# Patient Record
Sex: Female | Born: 1970 | Race: White | Hispanic: No | Marital: Married | Smoking: Never smoker
Health system: Southern US, Community
[De-identification: ages and names within clinical notes are randomized; demographics above are authoritative.]

## PROBLEM LIST (undated history)

## (undated) DIAGNOSIS — R002 Palpitations: Secondary | ICD-10-CM

## (undated) DIAGNOSIS — E039 Hypothyroidism, unspecified: Secondary | ICD-10-CM

## (undated) DIAGNOSIS — E079 Disorder of thyroid, unspecified: Secondary | ICD-10-CM

## (undated) DIAGNOSIS — E282 Polycystic ovarian syndrome: Secondary | ICD-10-CM

## (undated) HISTORY — DX: Disorder of thyroid, unspecified: E07.9

## (undated) HISTORY — DX: Polycystic ovarian syndrome: E28.2

## (undated) HISTORY — DX: Palpitations: R00.2

## (undated) HISTORY — PX: TONSILLECTOMY: SUR1361

---

## 1999-12-20 ENCOUNTER — Other Ambulatory Visit: Admission: RE | Admit: 1999-12-20 | Discharge: 1999-12-20 | Payer: Self-pay | Admitting: Obstetrics and Gynecology

## 2001-04-21 ENCOUNTER — Other Ambulatory Visit: Admission: RE | Admit: 2001-04-21 | Discharge: 2001-04-21 | Payer: Self-pay | Admitting: Obstetrics and Gynecology

## 2002-04-27 ENCOUNTER — Other Ambulatory Visit: Admission: RE | Admit: 2002-04-27 | Discharge: 2002-04-27 | Payer: Self-pay | Admitting: Obstetrics and Gynecology

## 2002-12-29 ENCOUNTER — Other Ambulatory Visit: Admission: RE | Admit: 2002-12-29 | Discharge: 2002-12-29 | Payer: Self-pay | Admitting: Obstetrics and Gynecology

## 2003-10-04 ENCOUNTER — Other Ambulatory Visit: Admission: RE | Admit: 2003-10-04 | Discharge: 2003-10-04 | Payer: Self-pay | Admitting: Obstetrics and Gynecology

## 2009-05-05 ENCOUNTER — Ambulatory Visit: Payer: Self-pay | Admitting: Family Medicine

## 2009-05-05 DIAGNOSIS — E282 Polycystic ovarian syndrome: Secondary | ICD-10-CM | POA: Insufficient documentation

## 2009-05-05 DIAGNOSIS — N912 Amenorrhea, unspecified: Secondary | ICD-10-CM | POA: Insufficient documentation

## 2009-05-05 DIAGNOSIS — E039 Hypothyroidism, unspecified: Secondary | ICD-10-CM | POA: Insufficient documentation

## 2009-05-05 DIAGNOSIS — R5381 Other malaise: Secondary | ICD-10-CM | POA: Insufficient documentation

## 2009-05-05 DIAGNOSIS — R002 Palpitations: Secondary | ICD-10-CM | POA: Insufficient documentation

## 2009-05-05 DIAGNOSIS — R5383 Other fatigue: Secondary | ICD-10-CM

## 2009-05-05 LAB — CONVERTED CEMR LAB: Beta hcg, urine, semiquantitative: NEGATIVE

## 2009-05-06 ENCOUNTER — Telehealth: Payer: Self-pay | Admitting: Family Medicine

## 2009-05-07 ENCOUNTER — Encounter: Payer: Self-pay | Admitting: Family Medicine

## 2009-05-07 LAB — CONVERTED CEMR LAB
Basophils Absolute: 0 10*3/uL (ref 0.0–0.1)
CO2: 26 meq/L (ref 19–32)
Calcium: 9.2 mg/dL (ref 8.4–10.5)
Chloride: 106 meq/L (ref 96–112)
Cholesterol: 202 mg/dL — ABNORMAL HIGH (ref 0–200)
Direct LDL: 151.7 mg/dL
Free T4: 1.2 ng/dL (ref 0.6–1.6)
HDL: 42.1 mg/dL (ref >39.00)
Lymphocytes Relative: 28.2 % (ref 12.0–46.0)
Monocytes Relative: 4.9 % (ref 3.0–12.0)
Neutrophils Relative %: 64.7 % (ref 43.0–77.0)
Platelets: 210 10*3/uL (ref 150.0–400.0)
RDW: 12.9 % (ref 11.5–14.6)
Sodium: 139 meq/L (ref 135–145)
TSH: 0.39 microintl units/mL (ref 0.35–5.50)
Total CHOL/HDL Ratio: 5
Triglycerides: 71 mg/dL (ref 0.0–149.0)
VLDL: 14.2 mg/dL (ref 0.0–40.0)

## 2009-05-09 ENCOUNTER — Ambulatory Visit: Payer: Self-pay | Admitting: Cardiology

## 2009-05-31 ENCOUNTER — Encounter: Payer: Self-pay | Admitting: Cardiology

## 2009-05-31 ENCOUNTER — Ambulatory Visit: Payer: Self-pay

## 2009-08-26 ENCOUNTER — Ambulatory Visit: Payer: Self-pay | Admitting: Family Medicine

## 2009-08-26 DIAGNOSIS — N920 Excessive and frequent menstruation with regular cycle: Secondary | ICD-10-CM | POA: Insufficient documentation

## 2009-08-29 LAB — CONVERTED CEMR LAB
Basophils Absolute: 0 10*3/uL (ref 0.0–0.1)
Hemoglobin: 8.7 g/dL — ABNORMAL LOW (ref 12.0–15.0)
LH: 7.24 milliintl units/mL
Lymphocytes Relative: 29.3 % (ref 12.0–46.0)
Monocytes Relative: 6.6 % (ref 3.0–12.0)
Neutro Abs: 4.3 10*3/uL (ref 1.4–7.7)
Neutrophils Relative %: 60.4 % (ref 43.0–77.0)
RBC: 2.82 M/uL — ABNORMAL LOW (ref 3.87–5.11)
RDW: 13 % (ref 11.5–14.6)

## 2009-08-30 ENCOUNTER — Ambulatory Visit: Payer: Self-pay | Admitting: Family Medicine

## 2009-08-30 DIAGNOSIS — D649 Anemia, unspecified: Secondary | ICD-10-CM | POA: Insufficient documentation

## 2009-08-30 LAB — CONVERTED CEMR LAB: hCG, Beta Chain, Quant, S: 0 milliintl units/mL

## 2009-08-31 LAB — CONVERTED CEMR LAB
Transferrin: 322.2 mg/dL (ref 212.0–360.0)
Vitamin B-12: 306 pg/mL (ref 211–911)

## 2010-09-12 NOTE — Assessment & Plan Note (Signed)
Summary: F/U DLO   Vital Signs:  Patient profile:   40 year old female Height:      68.5 inches Weight:      229.38 pounds BMI:     34.49 Temp:     98.5 degrees F oral Pulse rate:   76 / minute Pulse rhythm:   regular BP sitting:   134 / 88  (left arm) Cuff size:   large  Vitals Entered By: Delilah Shan CMA Duncan Dull) (August 30, 2009 9:57 AM) CC: follow-up visit   History of Present Illness: 40 yo female here for f/u  heavy period. Period has been getting lighter and lighter, but still spotting. This month, she started her period on 08/19/09 and it has been extremely heavy, going through a pad every few hours, will heavy blood clots. Now it is lightening up, using only one pad per day. Was a little dizzy when she stood up earlier this week, now feeling a little short of breath with exertion.  No cramping.  No CP. Checked CBC this week which showed normocytic anemia - hbg 8.7, with MCV 92.  Current Medications (verified): 1)  Synthroid 88 Mcg Tabs (Levothyroxine Sodium) .... Take 1 Tablet By Mouth Once A Day 2)  Ferrous Fumarate 325 Mg Tabs (Ferrous Fumarate) .Marland Kitchen.. 1 By Mouth Bid  Allergies (verified): No Known Drug Allergies  Review of Systems      See HPI General:  Complains of fatigue and malaise. CV:  Denies chest pain or discomfort. Neuro:  Denies headaches, sensation of room spinning, tingling, visual disturbances, and weakness.  Physical Exam  General:  alert and well-developed, obese.   Lungs:  Normal respiratory effort, chest expands symmetrically. Lungs are clear to auscultation, no crackles or wheezes. Heart:  Normal rate and regular rhythm. S1 and S2 normal without gallop, murmur, click, rub or other extra sounds. Psych:  normally interactive and good eye contact.     Impression & Recommendations:  Problem # 1:  ANEMIA, NORMOCYTIC (ICD-285.9) Assessment New  Likely due to menorrhagia although I would expect to see a microcytic anemia. So will check TIBC,  folate and B12 today. Start Ferrous Sulfate 325 mg two times a day. Per pt, OBGYN nurse asked her to get Bhcg blood test.   The following medications were removed from the medication list:    Ferrous Fumarate 325 Mg Tabs (Ferrous fumarate) .Marland Kitchen... 1 by mouth daily Her updated medication list for this problem includes:    Ferrous Fumarate 325 Mg Tabs (Ferrous fumarate) .Marland Kitchen... 1 by mouth bid  Orders: Venipuncture (16109) TLB-B12 + Folate Pnl (60454_09811-B14/[id number]) TLB-IBC Pnl (Iron/FE;Transferrin) (83550-IBC)  Complete Medication List: 1)  Synthroid 88 Mcg Tabs (Levothyroxine sodium) .... Take 1 tablet by mouth once a day 2)  Ferrous Fumarate 325 Mg Tabs (Ferrous fumarate) .Marland Kitchen.. 1 by mouth bid  Other Orders: T- * Misc. Laboratory test [phone])  Patient Instructions: 1)  Good to see you, Ms. Sher. 2)  Please come back to see me in 2-3 months. 3)  I will be in touch with your labs. Prescriptions: FERROUS FUMARATE 325 MG TABS (FERROUS FUMARATE) 1 by mouth bid  #90 x 3   Entered and Authorized by:   Ruthe Mannan MD   Signed by:   Ruthe Mannan MD on 08/30/2009   Method used:   Electronically to        [facility]       [address]       [place], [place]  [address]       Ph: [phone]       Fax: [phone]   RxID:   [id number] FERROUS FUMARATE 325 MG TABS (FERROUS FUMARATE) 1 by mouth daily  #90 x 3   Entered and Authorized by:   Ruthe Mannan MD   Signed by:   Ruthe Mannan MD on 08/30/2009   Method used:   Electronically to        [facility]       [address]       [place], [place]  [address]       Ph: [phone]       Fax: [phone]   RxID:   [id number]   Current Allergies (reviewed today): No known allergies  Appended Document: F/U DLO

## 2010-09-12 NOTE — Assessment & Plan Note (Signed)
Summary: CYCLE HEAVY OVER 1 WK-NO PAIN  CYD   Vital Signs:  Patient profile:   40 year old female Height:      68.5 inches Weight:      234 pounds BMI:     35.19 Temp:     98.6 degrees F oral Pulse rate:   84 / minute Pulse rhythm:   regular BP sitting:   150 / 84  (left arm) Cuff size:   large  Vitals Entered By: Delilah Shan CMA Duncan Dull) (August 26, 2009 10:17 AM) CC: Cycle heavy - no pain   History of Present Illness: 40 yo female here for heavy period.  Has had known PCOS for years.     Had tried Clomid and Metformin and stopped both due to unwanted side effects.  started seeing Cheryll Dessert due to infertility.  Had bee on OCPs which regulated her periods but she stopped months ago to try getting pregnant. Has been going to accupuncture to help which she says she feels much better since stopping all medication and going for accupuncture.  Since that time, periods have been very irregular, spotting throughout the month. This month, she started her period on 08/19/09 and it has been extremely heavy, going through a pad every few hours, will heavy blood clots. Now it is lightening up, using only one pad per day. Was a little dizzy when she stood up quickly yesterday, otherwise she feels normal. No cramping.    Current Medications (verified): 1)  Synthroid 88 Mcg Tabs (Levothyroxine Sodium) .... Take 1 Tablet By Mouth Once A Day  Allergies (verified): No Known Drug Allergies  Review of Systems      See HPI General:  Denies malaise. CV:  Denies chest pain or discomfort. Resp:  Denies shortness of breath.  Physical Exam  General:  alert and well-developed, obese.   Eyes:  No corneal or conjunctival inflammation noted. EOMI. Perrla. Funduscopic exam benign, without hemorrhages, exudates or papilledema. Vision grossly normal. Mouth:  Oral mucosa and oropharynx without lesions or exudates.  Teeth in good repair. Lungs:  Normal respiratory effort, chest expands symmetrically.  Lungs are clear to auscultation, no crackles or wheezes. Heart:  Normal rate and regular rhythm. S1 and S2 normal without gallop, murmur, click, rub or other extra sounds. Neurologic:  strength normal in all extremities.   Psych:  normally interactive and good eye contact.     Impression & Recommendations:  Problem # 1:  MENORRHAGIA (ICD-626.2) Assessment Improved Known PCOS.  Will check CBC to make sure she does not have iron deficiency anemia.  She is not interested in OCPs as she is trying to get pregnant.  Will test LH/FSH today as well as she is interested in knowing what her testosterone/estrogen level.  After we check FSH/LH, will plan on checking DHEA-S, testosterone if she would like to continue work up.  I did mention that she should return to infertliity specialist if she truly would like to pursue this.  She will discuss with husband. Orders: Venipuncture (87564) TLB-CBC Platelet - w/Differential (85025-CBCD) Venipuncture (33295) TLB-FSH (Follicle Stimulating Hormone) (83001-FSH) TLB-Luteinizing Hormone (LH) (83002-LH)  Complete Medication List: 1)  Synthroid 88 Mcg Tabs (Levothyroxine sodium) .... Take 1 tablet by mouth once a day  Current Allergies (reviewed today): No known allergies

## 2010-10-06 ENCOUNTER — Telehealth: Payer: Self-pay | Admitting: Family Medicine

## 2010-10-10 NOTE — Progress Notes (Signed)
Summary: Possible miscarriage  Phone Note Call from Patient   Caller: Patient Call For: Ruthe Mannan MD Summary of Call: Patient called to say that she thinks she may have had a miscarriage.  Advised patient to go to the ER because she may have to have an I&D and that is something we don't do here in the office.  Stated that she woke up during the night bleeding pretty heavy and passing blood clots as big as her hand.  Advised patient to call us back and let us know how she is doing but go on over to the ER. Initial call taken by: Linde Gillis CMA Duncan Dull),  October 06, 2010 8:21 AM  Follow-up for Phone Call        yes she needs to go to [facility] immediately.  please keep me updated. Ruthe Mannan MD  October 06, 2010 9:37 AM  Patient already advised.  Follow-up by: Linde Gillis CMA Duncan Dull),  October 06, 2010 9:42 AM

## 2010-12-29 ENCOUNTER — Encounter: Payer: Self-pay | Admitting: Family Medicine

## 2011-01-01 ENCOUNTER — Encounter: Payer: Self-pay | Admitting: Family Medicine

## 2011-01-01 ENCOUNTER — Ambulatory Visit (INDEPENDENT_AMBULATORY_CARE_PROVIDER_SITE_OTHER): Payer: BC Managed Care – PPO | Admitting: Family Medicine

## 2011-01-01 VITALS — BP 140/90 | HR 90 | Temp 98.3°F | Ht 68.5 in | Wt 229.4 lb

## 2011-01-01 DIAGNOSIS — E039 Hypothyroidism, unspecified: Secondary | ICD-10-CM

## 2011-01-01 DIAGNOSIS — E282 Polycystic ovarian syndrome: Secondary | ICD-10-CM

## 2011-01-01 LAB — TSH: TSH: 0.75 u[IU]/mL (ref 0.35–5.50)

## 2011-01-01 NOTE — Progress Notes (Signed)
40 yo female here to discuss infertility.  Has had known PCOS and hypothyroidism  for years.      Had tried Clomid and Metformin and stopped both due to unwanted side effects.  started seeing Cheryll Dessert due to infertility.  Had been on OCPs which regulated her periods but she years ago to get pregnant. Has been going to accupuncture to help which she says she feels much better since stopping all medication and going for accupuncture.  Since that time, periods have been very irregular, spotting throughout the month.  Seeing Dr. Billy Coast.  Wants to see another OBGYN but is not yet ready for IVF.    Hypothyroidism- does feel like her skin is dry and hair is brittle. Denies any other symptoms of hypo or hyperthyroidism.   Review of Systems       See HPI General:  Denies malaise. CV:  Denies chest pain or discomfort. Resp:  Denies shortness of breath.  Physical Exam BP 140/90  Pulse 90  Temp(Src) 98.3 F (36.8 C) (Oral)  Ht 5' 8.5" (1.74 m)  Wt 229 lb 6.4 oz (104.055 kg)  BMI 34.37 kg/m2  LMP 12/04/2010  General:  alert and well-developed, obese.   Eyes:  No corneal or conjunctival inflammation noted. EOMI. Perrla. Funduscopic exam benign, without hemorrhages, exudates or papilledema. Vision grossly normal. Mouth:  Oral mucosa and oropharynx without lesions or exudates.  Teeth in good repair. Lungs:  Normal respiratory effort, chest expands symmetrically. Lungs are clear to auscultation, no crackles or wheezes. Heart:  Normal rate and regular rhythm. S1 and S2 normal without gallop, murmur, click, rub or other extra sounds. Neurologic:  strength normal in all extremities.   Psych:  Tearful, good eye contact.

## 2011-01-01 NOTE — Assessment & Plan Note (Signed)
Unchanged with infertility issues. >25 min spent with face to face with patient counseling and coordinating care Given names of other OBGYNs in town. Pt will call around and let me know where she decides on establishing care.

## 2011-01-01 NOTE — Assessment & Plan Note (Signed)
Recheck TSH today.  

## 2011-08-08 ENCOUNTER — Ambulatory Visit (INDEPENDENT_AMBULATORY_CARE_PROVIDER_SITE_OTHER): Payer: BC Managed Care – PPO | Admitting: Internal Medicine

## 2011-08-08 ENCOUNTER — Encounter: Payer: Self-pay | Admitting: Internal Medicine

## 2011-08-08 DIAGNOSIS — R509 Fever, unspecified: Secondary | ICD-10-CM

## 2011-08-08 DIAGNOSIS — J111 Influenza due to unidentified influenza virus with other respiratory manifestations: Secondary | ICD-10-CM

## 2011-08-08 DIAGNOSIS — K649 Unspecified hemorrhoids: Secondary | ICD-10-CM

## 2011-08-08 LAB — POCT INFLUENZA A/B
Influenza A, POC: NEGATIVE
Influenza B, POC: NEGATIVE

## 2011-08-08 MED ORDER — HYDROCORTISONE 2.5 % RE CREA: TOPICAL_CREAM | Freq: Two times a day (BID) | RECTAL | Status: AC

## 2011-08-08 MED ORDER — OSELTAMIVIR PHOSPHATE 75 MG PO CAPS: 75.0000 mg | ORAL_CAPSULE | Freq: Two times a day (BID) | ORAL | Status: AC

## 2011-08-08 NOTE — Progress Notes (Signed)
Subjective:    Patient ID: Melanie Hanson, female    DOB: [birth year], 40 y.o.   MRN: [id number]  HPI 40 year old female presents for an acute visit complaining of sudden onset this morning of fever, chills, myalgia, general malaise. She also notes mild nausea. She has not taken any medication for her symptoms. 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 She did not have her flu shot.   Outpatient Encounter Prescriptions as of 08/08/2011  Medication Sig Dispense Refill  . levothyroxine (SYNTHROID, LEVOTHROID) 88 MCG tablet Take 88 mcg by mouth daily.        . hydrocortisone (ANUSOL-HC) 2.5 % rectal cream Place rectally 2 (two) times daily.  30 g  0    Review of Systems  Constitutional: Positive for fever and chills. Negative for appetite change, fatigue and unexpected weight change.  HENT: Positive for congestion. Negative for ear pain, sore throat, trouble swallowing, neck pain, voice change and sinus pressure.   Eyes: Negative for visual disturbance.  Respiratory: Positive for cough. Negative for shortness of breath, wheezing and stridor.   Cardiovascular: Negative for chest pain, palpitations and leg swelling.  Gastrointestinal: Negative for nausea, vomiting, abdominal pain, diarrhea, constipation, blood in stool, abdominal distention and anal bleeding.  Genitourinary: Negative for dysuria and flank pain.  Musculoskeletal: Positive for myalgias. Negative for arthralgias and gait problem.  Skin: Negative for color change and rash.  Neurological: Negative for dizziness and headaches.  Hematological: Negative for adenopathy. Does not bruise/bleed easily.  Psychiatric/Behavioral: Negative for suicidal ideas, sleep disturbance and dysphoric mood. The patient is not nervous/anxious.    BP 138/80  Pulse 95  Temp(Src) 100.3 F (37.9 C) (Oral)  Wt 221 lb (100.245 kg)  SpO2 97%     Objective:   Physical Exam  Constitutional: She is oriented to person, place, and time. She appears well-developed and well-nourished. No distress.  HENT:  Head: Normocephalic and atraumatic.  Right Ear: External ear normal. A middle ear effusion is present.  Left Ear: External ear normal. A middle ear effusion is present.  Nose: Nose normal.  Mouth/Throat: Oropharynx is clear and moist. No oropharyngeal exudate.  Eyes: Conjunctivae are normal. Pupils are equal, round, and reactive to light. Right eye exhibits no discharge. Left eye exhibits no discharge. No scleral icterus.  Neck: Normal range of motion. Neck supple. No tracheal deviation present. No thyromegaly present.  Cardiovascular: Normal rate, regular rhythm, normal heart sounds and intact distal pulses.  Exam reveals no gallop and no friction rub.   No murmur heard. Pulmonary/Chest: Effort normal and breath sounds normal. No respiratory distress. She has no wheezes. She has no rales. She exhibits no tenderness.  Musculoskeletal: Normal range of motion. She exhibits no edema and no tenderness.  Lymphadenopathy:    She has no cervical adenopathy.  Neurological: She is alert and oriented to person, place, and time. No cranial nerve deficit. She exhibits normal muscle tone. Coordination normal.  Skin: Skin is warm and dry. No rash noted. She is not diaphoretic. No erythema. No pallor.  Psychiatric: She has a normal mood and affect. Her behavior is normal. Judgment and thought content normal.          Assessment & Plan:  1. Influenza - Symptoms with sudden onset fever, chills, myalgia are most consistent with influenza. Will treat with Tamiflu 75mg  po bid x 5 days. Pt will call if symptoms not  improving over next 72hr.

## 2011-10-12 ENCOUNTER — Encounter: Payer: Self-pay | Admitting: Family Medicine

## 2011-10-12 ENCOUNTER — Ambulatory Visit (INDEPENDENT_AMBULATORY_CARE_PROVIDER_SITE_OTHER): Payer: BC Managed Care – PPO | Admitting: Family Medicine

## 2011-10-12 VITALS — BP 122/74 | HR 94 | Temp 99.3°F | Wt 215.2 lb

## 2011-10-12 DIAGNOSIS — J111 Influenza due to unidentified influenza virus with other respiratory manifestations: Secondary | ICD-10-CM

## 2011-10-12 MED ORDER — OSELTAMIVIR PHOSPHATE 75 MG PO CAPS: 75.0000 mg | ORAL_CAPSULE | Freq: Two times a day (BID) | ORAL | Status: DC

## 2011-10-12 NOTE — Progress Notes (Signed)
  Subjective:    Patient ID: Melanie Hanson, female    DOB: 1970-10-12, 41 y.o.   MRN: [id number]  HPI Started getting sick wed night - started with a cough  Last night fever 102 Also achey and chilled  Chest congestion - not painful Some mucous - is clear at this point  Throat and ears are fine Just a little runny nose  No n/v/d    Has had flu and uri this season  Last uri lasted 3 weeks Did not have a flu shot this year   Patient Active Problem List  Diagnoses  . Unspecified hypothyroidism  . POLYCYSTIC OVARIAN DISEASE  . ANEMIA, NORMOCYTIC  . AMENORRHEA  . MENORRHAGIA  . FATIGUE  . PALPITATIONS  . Influenza with respiratory manifestations   Past Medical History  Diagnosis Date  . Thyroid disease   . PCOS (polycystic ovarian syndrome)   . Palpitations    Past Surgical History  Procedure Date  . Tonsillectomy    History  Substance Use Topics  . Smoking status: Never Smoker   . Smokeless tobacco: Not on file  . Alcohol Use: Yes   Family History  Problem Relation Age of Onset  . Hypertension Father   . Diabetes Paternal Grandfather    No Known Allergies Current Outpatient Prescriptions on File Prior to Visit  Medication Sig Dispense Refill  . levothyroxine (SYNTHROID, LEVOTHROID) 88 MCG tablet Take 88 mcg by mouth daily.            Review of Systems Review of Systems  Constitutional: Negative for  unexpected weight change.  Eyes: Negative for pain and visual disturbance.  ENT pos for rhinorrhea/ cong neg for ear pain  Respiratory: pos for cough with clear mucous , neg for wheeze or sob    Cardiovascular: Negative for cp or palpitations    Gastrointestinal: Negative for nausea, diarrhea and constipation.  Genitourinary: Negative for urgency and frequency.  Skin: Negative for pallor or rash   Neurological: Negative for weakness, light-headedness, numbness and headaches.  Hematological: Negative for adenopathy. Does not bruise/bleed easily.    Psychiatric/Behavioral: Negative for dysphoric mood. The patient is not nervous/anxious.          Objective:   Physical Exam  Constitutional: She appears well-developed and well-nourished. No distress.  HENT:  Head: Normocephalic and atraumatic.  Right Ear: External ear normal.  Left Ear: External ear normal.  Mouth/Throat: Oropharynx is clear and moist. No oropharyngeal exudate.       Nares are injected and congested  No sinus tenderness Throat - post clear nasal drip- no erythema   Eyes: Conjunctivae and EOM are normal. Pupils are equal, round, and reactive to light. Right eye exhibits no discharge. Left eye exhibits no discharge. No scleral icterus.  Neck: Normal range of motion. Neck supple. No thyromegaly present.  Cardiovascular: Normal rate, regular rhythm and normal heart sounds.   Pulmonary/Chest: Effort normal and breath sounds normal. No respiratory distress. She has no wheezes. She has no rales. She exhibits no tenderness.       Harsh bs Loud cough    Abdominal: Soft. Bowel sounds are normal.  Lymphadenopathy:    She has no cervical adenopathy.  Skin: Skin is dry. No rash noted.  Psychiatric: She has a normal mood and affect.          Assessment & Plan:

## 2011-10-12 NOTE — Patient Instructions (Signed)
I think you have the flu  Tylenol for fever / aches / chills - up to every 4 hours (fever goes up at night) Start tamiflu- take for 5 days  Drink lots of fluids and rest this week Try mucinex DM for cough as needed

## 2011-10-12 NOTE — Assessment & Plan Note (Signed)
Within 48 hours- will start tamiflu Disc symptomatic care - see instructions on AVS  Update if not starting to improve in a week or if worsening   Disc staying out of work until better  Will rest this wkend

## 2011-10-16 ENCOUNTER — Encounter (HOSPITAL_COMMUNITY): Payer: Self-pay | Admitting: Emergency Medicine

## 2011-10-16 ENCOUNTER — Emergency Department (HOSPITAL_COMMUNITY)
Admission: EM | Admit: 2011-10-16 | Discharge: 2011-10-16 | Disposition: A | Discharge status: Home / Self Care | Payer: BC Managed Care – PPO | Attending: Emergency Medicine | Admitting: Emergency Medicine

## 2011-10-16 DIAGNOSIS — R404 Transient alteration of awareness: Secondary | ICD-10-CM | POA: Insufficient documentation

## 2011-10-16 DIAGNOSIS — R55 Syncope and collapse: Secondary | ICD-10-CM

## 2011-10-16 DIAGNOSIS — E282 Polycystic ovarian syndrome: Secondary | ICD-10-CM | POA: Insufficient documentation

## 2011-10-16 DIAGNOSIS — N898 Other specified noninflammatory disorders of vagina: Secondary | ICD-10-CM | POA: Insufficient documentation

## 2011-10-16 DIAGNOSIS — E039 Hypothyroidism, unspecified: Secondary | ICD-10-CM | POA: Insufficient documentation

## 2011-10-16 HISTORY — DX: Hypothyroidism, unspecified: E03.9

## 2011-10-16 LAB — BASIC METABOLIC PANEL
BUN: 10 mg/dL (ref 6–23)
CO2: 25 mEq/L (ref 19–32)
Calcium: 8.8 mg/dL (ref 8.4–10.5)
Chloride: 98 mEq/L (ref 96–112)
Creatinine, Ser: 0.63 mg/dL (ref 0.50–1.10)
GFR calc Af Amer: >90 mL/min (ref >90)
GFR calc non Af Amer: >90 mL/min (ref >90)
Glucose, Bld: 146 mg/dL — ABNORMAL HIGH (ref 70–99)
Potassium: 4.2 mEq/L (ref 3.5–5.1)
Sodium: 132 mEq/L — ABNORMAL LOW (ref 135–145)

## 2011-10-16 LAB — DIFFERENTIAL
Basophils Absolute: 0 10*3/uL (ref 0.0–0.1)
Basophils Relative: 1 % (ref 0–1)
Eosinophils Absolute: 0 10*3/uL (ref 0.0–0.7)
Eosinophils Relative: 1 % (ref 0–5)
Lymphocytes Relative: 44 % (ref 12–46)
Lymphs Abs: 1.5 10*3/uL (ref 0.7–4.0)
Monocytes Absolute: 0.4 10*3/uL (ref 0.1–1.0)
Monocytes Relative: 11 % (ref 3–12)
Neutro Abs: 1.5 10*3/uL — ABNORMAL LOW (ref 1.7–7.7)
Neutrophils Relative %: 44 % (ref 43–77)

## 2011-10-16 LAB — URINE MICROSCOPIC-ADD ON

## 2011-10-16 LAB — URINALYSIS, ROUTINE W REFLEX MICROSCOPIC
Bilirubin Urine: NEGATIVE
Glucose, UA: NEGATIVE mg/dL
Ketones, ur: NEGATIVE mg/dL
Leukocytes, UA: NEGATIVE
Nitrite: NEGATIVE
Protein, ur: NEGATIVE mg/dL
Specific Gravity, Urine: 1.022 (ref 1.005–1.030)
Urobilinogen, UA: 0.2 mg/dL (ref 0.0–1.0)
pH: 6 (ref 5.0–8.0)

## 2011-10-16 LAB — CBC
HCT: 29.2 % — ABNORMAL LOW (ref 36.0–46.0)
Hemoglobin: 9.4 g/dL — ABNORMAL LOW (ref 12.0–15.0)
MCH: 23.8 pg — ABNORMAL LOW (ref 26.0–34.0)
MCHC: 32.2 g/dL (ref 30.0–36.0)
MCV: 73.9 fL — ABNORMAL LOW (ref 78.0–100.0)
Platelets: 197 10*3/uL (ref 150–400)
RBC: 3.95 MIL/uL (ref 3.87–5.11)
RDW: 19.9 % — ABNORMAL HIGH (ref 11.5–15.5)
WBC: 3.3 10*3/uL — ABNORMAL LOW (ref 4.0–10.5)

## 2011-10-16 LAB — POCT PREGNANCY, URINE: Preg Test, Ur: NEGATIVE

## 2011-10-16 MED ORDER — SODIUM CHLORIDE 0.9 % IV BOLUS (SEPSIS)
2000.0000 mL | Freq: Once | INTRAVENOUS | Status: AC
  Administered 2011-10-16: 2000 mL via INTRAVENOUS

## 2011-10-16 NOTE — Discharge Instructions (Signed)
Syncope Syncope (fainting) is a sudden, short loss of consciousness. People normally fall to the ground when they faint. Recovery is often fast. HOME CARE  Do not drive or use machines. Wait until your doctor says it is safe to do so.   If you have diabetes, check your blood sugar. If it is low (below 70), you need to drink or eat something sweet. If over 300, call your doctor.   If you have a blood pressure machine at home, take your blood pressure. If the top number is below 100 or above 170, call your doctor.   Lie down until you feel normal.   Drink extra fluids (water, juice, soup).  GET HELP RIGHT AWAY IF:   You pass out (faint) when sitting or lying down. Do not drive. Call your local emergency services (911 in [place] if no one is there to help you.   There is chest pain.   You feel sick to your stomach (nauseous) or keep throwing up (vomiting).   You have very bad belly (abdominal) pain.   You feel your heartbeat is fast or not normal.   You lose feeling in some part of the body.   You cannot move your arms or legs.   You cannot talk well and get confused.   You feel weak or cannot see well.   You get sweaty and feel lightheaded.  MAKE SURE YOU:   Understand these instructions.   Will watch your condition.   Will get help right away if you are not doing well or get worse.  Document Released: 01/16/2008 Document Revised: 07/19/2011 Document Reviewed: 01/16/2008 [facility] Patient Information 2012 [place], [place].  RESOURCE GUIDE  Dental Problems  Patients with Medicaid: [facility] [phone] [address]                                           [phone] [address] Phone:  [phone]                                                  Phone:  [phone]  If unable to pay or uninsured, contact:  [facility] or [facility]. to become qualified for the adult dental clinic.  Chronic Pain  Problems Contact [facility]  [phone] Patients need to be referred by their primary care doctor.  Insufficient Money for Medicine Contact [employer]:  call "211" or [facility] [phone].  No Primary Care Doctor Call [facility]  [phone] Other agencies that provide inexpensive medical care    [facility]  [phone]    [facility]  [phone]    [facility]  [phone]    [facility]  [phone]    [facility]  5390544592    [facility]  [phone]  Psychological Services [facility]  [phone] [facility]  [phone] [facility]   [phone] (emergency services [phone])  Substance Abuse Resources [facility]  [phone] [facility] [phone] [facility] [phone] [facility] [phone] Residential &  Outpatient Substance Abuse Program  ([phone]  Abuse/Neglect [facility] Child Abuse Hotline 912-346-1605 [facility] Child Abuse Hotline (669) 366-7930 (After Hours)  Emergency Shelter [facility] [phone]  Maternity Homes Room at the [place] [facility] [place] [phone] [facility] 6786544946  MRSA Hotline #:   (916)243-6528    [facility] Resources  [facility]     [employer]                          [facility] Dept. [address] [place]                       [address]      [address] [place] [address]  Blondell Reveal Phone:  [phone]                                   Phone:  [phone]                 Phone:  [phone]  [facility] Mental [facility] Phone:  [phone]  [facility] Child Abuse Hotline [phone] (After Hours)

## 2011-10-16 NOTE — ED Notes (Signed)
PT. REPORTS HEAVY MENSTRUAL BLEEDING / PASSING CLOTS TODAY , NEAR SYNCOPAL EPISODE AT HOME WITH WEAKNESS.

## 2011-10-16 NOTE — ED Provider Notes (Signed)
History    41 year old female with a syncopal event. Patient is ambulating at home when she felt like she is going to pass out. Her husband caught her in order to the ground. Patient apparently did briefly lose consciousness for a period of seconds. There is no seizure activity. There is no postictal state. There is no incontinence. No tongue biting. Patient has a history of PCOS and heavy vaginal bleeding. She has been passing several large clots recently, but this is not uncommon for her. Patient is also getting over a febrile illness which she calls the flu. Patient is complaining of some mild lower, pain which is also not unusual for her. No urinary complaints. No chest pain or shortness of breath. No fevers or chills. No nausea or vomiting.  CSN: [id number]  Arrival date & time 10/16/11  0039   None     Chief Complaint  Patient presents with  . Vaginal Bleeding  . Loss of Consciousness    (Consider location/radiation/quality/duration/timing/severity/associated sxs/prior treatment) HPI  Past Medical History  Diagnosis Date  . Thyroid disease   . PCOS (polycystic ovarian syndrome)   . Palpitations   . Hypothyroidism     Past Surgical History  Procedure Date  . Tonsillectomy     Family History  Problem Relation Age of Onset  . Hypertension Father   . Diabetes Paternal Grandfather     History  Substance Use Topics  . Smoking status: Never Smoker   . Smokeless tobacco: Not on file  . Alcohol Use: Yes    OB History    Grav Para Term Preterm Abortions TAB SAB Ect Mult Living                  Review of Systems   Review of symptoms negative unless otherwise noted in HPI.   Allergies  Review of patient's allergies indicates no known allergies.  Home Medications   Current Outpatient Rx  Name Route Sig Dispense Refill  . LEVOTHYROXINE SODIUM 88 MCG PO TABS Oral Take 88 mcg by mouth daily.      . ADULT MULTIVITAMIN W/MINERALS CH Oral Take 1 tablet by mouth  daily.      BP 117/71  Temp(Src) 98.2 F (36.8 C) (Oral)  Resp 20  SpO2 99%  LMP 10/11/2011  Physical Exam  Nursing note and vitals reviewed. Constitutional: She is oriented to person, place, and time. She appears well-developed and well-nourished. No distress.  HENT:  Head: Normocephalic and atraumatic.  Eyes: Conjunctivae are normal. Pupils are equal, round, and reactive to light. Right eye exhibits no discharge. Left eye exhibits no discharge.  Neck: Neck supple.  Cardiovascular: Normal rate, regular rhythm and normal heart sounds.  Exam reveals no gallop and no friction rub.   No murmur heard. Pulmonary/Chest: Effort normal and breath sounds normal. No respiratory distress.  Abdominal: Soft. She exhibits no distension and no mass. There is no tenderness.  Genitourinary:       No costovertebral angle  Musculoskeletal: She exhibits no edema and no tenderness.  Neurological: She is alert and oriented to person, place, and time. No cranial nerve deficit. She exhibits normal muscle tone. Coordination normal.  Skin: Skin is warm and dry. She is not diaphoretic.  Psychiatric: She has a normal mood and affect. Her behavior is normal. Thought content normal.    ED Course  Procedures (including critical care time)  Labs Reviewed  DIFFERENTIAL - Abnormal; Notable for the following:    Neutro Abs 1.5 (*)  All other components within normal limits  BASIC METABOLIC PANEL - Abnormal; Notable for the following:    Sodium 132 (*)    Glucose, Bld 146 (*)    All other components within normal limits  CBC  URINALYSIS, ROUTINE W REFLEX MICROSCOPIC   No results found.  3:18 AM Laboratory was called concerning the CBC. Per the laboratory tech, only the differential was ordered and not CBC. They will run this now.  EKG:  Rhythm: Normal sinus rhythm Rate: 80 Axis: Normal Intervals: Normal ST segments: Nonspecific T wave changes. There is slight T wave inversions  anteriorly. Comparison: None provided   1. Syncope       MDM  41 year old female with a syncopal event. Suspect that this is related to recent illness and dehydration. Patient is having vaginal bleeding but has a history of polycystic ovarian syndrome and says her current bleeding is not unusual for her at all. Patient is nontoxic. Hemodynamically stable. Nonfocal neurological examination Abdominal exam is benign. EKG is non-provocative. Patient was given a couple liters of IV fluids reports feeling much better. Observed ambulating in emergency room with a steady gait. Patient has no new complaints prior to discharge. Return precautions were discussed with patient and her husband. Outpatient followup.        Raeford Razor, MD 10/16/11 2230

## 2012-11-24 ENCOUNTER — Other Ambulatory Visit: Payer: Self-pay

## 2012-11-24 DIAGNOSIS — Z1231 Encounter for screening mammogram for malignant neoplasm of breast: Secondary | ICD-10-CM

## 2012-12-15 ENCOUNTER — Ambulatory Visit
Admission: RE | Admit: 2012-12-15 | Discharge: 2012-12-15 | Disposition: A | Discharge status: Home / Self Care | Payer: BC Managed Care – PPO | Source: Ambulatory Visit

## 2012-12-15 DIAGNOSIS — Z1231 Encounter for screening mammogram for malignant neoplasm of breast: Secondary | ICD-10-CM

## 2013-05-20 ENCOUNTER — Encounter (INDEPENDENT_AMBULATORY_CARE_PROVIDER_SITE_OTHER): Payer: Self-pay

## 2013-05-20 ENCOUNTER — Ambulatory Visit (INDEPENDENT_AMBULATORY_CARE_PROVIDER_SITE_OTHER): Payer: BC Managed Care – PPO | Admitting: Adult Health

## 2013-05-20 ENCOUNTER — Telehealth: Payer: Self-pay | Admitting: *Deleted

## 2013-05-20 ENCOUNTER — Encounter: Payer: Self-pay | Admitting: Adult Health

## 2013-05-20 VITALS — BP 122/84 | HR 72 | Temp 98.2°F | Resp 12 | Wt 245.0 lb

## 2013-05-20 DIAGNOSIS — R5381 Other malaise: Secondary | ICD-10-CM

## 2013-05-20 DIAGNOSIS — N920 Excessive and frequent menstruation with regular cycle: Secondary | ICD-10-CM

## 2013-05-20 DIAGNOSIS — Z8742 Personal history of other diseases of the female genital tract: Secondary | ICD-10-CM

## 2013-05-20 DIAGNOSIS — R5383 Other fatigue: Secondary | ICD-10-CM

## 2013-05-20 DIAGNOSIS — E039 Hypothyroidism, unspecified: Secondary | ICD-10-CM

## 2013-05-20 LAB — BASIC METABOLIC PANEL
BUN: 12 mg/dL (ref 6–23)
Chloride: 103 mEq/L (ref 96–112)
Creatinine, Ser: 0.7 mg/dL (ref 0.4–1.2)
Glucose, Bld: 95 mg/dL (ref 70–99)

## 2013-05-20 LAB — HEMOGLOBIN: Hemoglobin: 11.5 g/dL — ABNORMAL LOW (ref 12.0–15.0)

## 2013-05-20 NOTE — Progress Notes (Signed)
  Subjective:    Patient ID: Melanie Hanson, female    DOB: 1971/05/21, 42 y.o.   MRN: [id number]  HPI  Pt is a pleasant 42 year old female with hx of PCOS and hypothyroidism who presents to clinic with concerns of heavy menstrual bleeding and fear that her hemoglobin and iron level might be too low. She is complaining of fatigue. Denies any pelvic or abdominal pain. She is very emotional during the visit as she is discussing that she has been the only income provided in her home for the past 6 years. Her husband lost his job and has not found employment since. She would like to get pregnant and has been trying to for some time now without success. She is followed by OB/GYN in [place]. Discussion of possibly restarting Clomid. She is currently not being followed by an endocrinologist.   Current Outpatient Prescriptions on File Prior to Visit  Medication Sig Dispense Refill  . levothyroxine (SYNTHROID, LEVOTHROID) 88 MCG tablet Take 88 mcg by mouth daily.        . Multiple Vitamin (MULITIVITAMIN WITH MINERALS) TABS Take 1 tablet by mouth daily.       No current facility-administered medications on file prior to visit.     Review of Systems  Constitutional: Negative.   Respiratory: Negative.   Cardiovascular: Negative.   Gastrointestinal: Negative.   Genitourinary: Positive for menstrual problem.  Psychiatric/Behavioral: The patient is nervous/anxious.        Objective:   Physical Exam  Constitutional: She is oriented to person, place, and time.  Pleasant 42 year old female, tearful during visit. No apparent physical distress  HENT:  Head: Normocephalic and atraumatic.  Cardiovascular: Normal rate, regular rhythm and normal heart sounds.  Exam reveals no gallop.   No murmur heard. Pulmonary/Chest: Effort normal and breath sounds normal. No respiratory distress. She has no wheezes. She has no rales.  Neurological: She is alert and oriented to person, place, and time.  Psychiatric:  Judgment and thought content normal.  Tearful. Appears mildly depressed given circumstances.    BP 122/84  Pulse 72  Temp(Src) 98.2 F (36.8 C) (Oral)  Resp 12  Wt 245 lb (111.131 kg)  BMI 36.71 kg/m2  SpO2 97%       Assessment & Plan:

## 2013-05-20 NOTE — Assessment & Plan Note (Signed)
Check TSH, vitamin D and vitamin B12 levels

## 2013-05-20 NOTE — Assessment & Plan Note (Signed)
Followed by OB/GYN in [place]. Check iron, ferritin, hemoglobin.

## 2013-05-20 NOTE — Assessment & Plan Note (Signed)
Check TSH 

## 2013-05-20 NOTE — Telephone Encounter (Signed)
Pt requested refill of Jublia for toenail fungus. Left message for pt that she would need to contact her PCP for refill per Raquel.

## 2013-05-21 LAB — TSH: TSH: 0.79 u[IU]/mL (ref 0.35–5.50)

## 2013-05-21 LAB — FERRITIN: Ferritin: 4.6 ng/mL — ABNORMAL LOW (ref 10.0–291.0)

## 2013-05-21 LAB — VITAMIN B12: Vitamin B-12: 323 pg/mL (ref 211–911)

## 2013-05-22 ENCOUNTER — Encounter: Payer: Self-pay | Admitting: *Deleted

## 2014-03-02 ENCOUNTER — Ambulatory Visit (INDEPENDENT_AMBULATORY_CARE_PROVIDER_SITE_OTHER): Payer: BC Managed Care – PPO | Admitting: Family Medicine

## 2014-03-02 ENCOUNTER — Encounter: Payer: Self-pay | Admitting: Family Medicine

## 2014-03-02 VITALS — BP 118/78 | HR 82 | Temp 98.4°F | Ht 68.25 in | Wt 242.5 lb

## 2014-03-02 DIAGNOSIS — E282 Polycystic ovarian syndrome: Secondary | ICD-10-CM

## 2014-03-02 DIAGNOSIS — E039 Hypothyroidism, unspecified: Secondary | ICD-10-CM

## 2014-03-02 DIAGNOSIS — IMO0002 Reserved for concepts with insufficient information to code with codable children: Secondary | ICD-10-CM

## 2014-03-02 DIAGNOSIS — R635 Abnormal weight gain: Secondary | ICD-10-CM | POA: Insufficient documentation

## 2014-03-02 DIAGNOSIS — S3992XA Unspecified injury of lower back, initial encounter: Secondary | ICD-10-CM

## 2014-03-02 LAB — T3, FREE: T3 FREE: 2.5 pg/mL (ref 2.3–4.2)

## 2014-03-02 LAB — TSH: TSH: 1.54 u[IU]/mL (ref 0.35–4.50)

## 2014-03-02 LAB — T4, FREE: FREE T4: 1.05 ng/dL (ref 0.60–1.60)

## 2014-03-02 MED ORDER — CYCLOBENZAPRINE HCL 5 MG PO TABS: 5.0000 mg | ORAL_TABLET | Freq: Every day | ORAL | Status: DC

## 2014-03-02 NOTE — Progress Notes (Signed)
   Subjective:   Patient ID: Melanie Hanson, female    DOB: 05-30-1971, 43 y.o.   MRN: [id number]  Melanie EchevariaCarmen B Schachter is a pleasant 43 y.o. year old female with h/o hypothyroidism and PCOS who presents to clinic today with Weight Gain and Back Pain  on 03/02/2014  HPI: I have not seen pt since 12/2010.  Weight gain- she has been walking but feels she cannot lose weight. Wt Readings from Last 3 Encounters:  03/02/14 242 lb 8 oz (109.997 kg)  05/20/13 245 lb (111.131 kg)  10/12/11 215 lb 4 oz (97.637 kg)    On synthroid 88 mcg daily for hypothyroidism.   Dr. Billy Coastaavon checked TSH in June and she was told it was abnormal. Has been feeling "more moody" and having hot flashes. No changes in her bowel habits or skin. Lab Results  Component Value Date   TSH 0.79 05/20/2013   Back injury- picked up her 16seven year old niece last Thursday, immediately felt a "catch."  Since then left lower back hurts.  Worse with changing positions.  No LE weakness or radiculopathy.  Current Outpatient Prescriptions on File Prior to Visit  Medication Sig Dispense Refill  . levothyroxine (SYNTHROID, LEVOTHROID) 88 MCG tablet Take 88 mcg by mouth daily.        . Multiple Vitamin (MULITIVITAMIN WITH MINERALS) TABS Take 1 tablet by mouth daily.       No current facility-administered medications on file prior to visit.    No Known Allergies  Past Medical History  Diagnosis Date  . Thyroid disease   . PCOS (polycystic ovarian syndrome)   . Palpitations   . Hypothyroidism     Past Surgical History  Procedure Laterality Date  . Tonsillectomy      Family History  Problem Relation Age of Onset  . Hypertension Father   . Diabetes Paternal Grandfather     History   Social History  . Marital Status: Married    Spouse Name: N/A    Number of Children: N/A  . 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 Smoking status: Never Smoker   .  Smokeless tobacco: Not on file  . Alcohol Use: Yes  . Drug Use: No  . Sexual Activity:    Other Topics Concern  . Not on file   Social History Narrative   [place] in [place]   The PMH, PSH, Social History, Family History, Medications, and allergies [facility], and have been updated if relevant.    Review of Systems See HPI Denies any urinary incontinence +anxiety but denies feeling depressed- no SI    Objective:    BP 118/78  Pulse 82  Temp(Src) 98.4 F (36.9 C) (Oral)  Ht 5' 8.25" (1.734 m)  Wt 242 lb 8 oz (109.997 kg)  BMI 36.58 kg/m2  SpO2 98%  LMP 12/17/2013   Physical Exam Gen: alert, obese, NAD HEENT: Enlarged thyroid without masses or TTP MSK:+ tightness and TTP over left paraspinous muscles, SLR neg bilaterally, normal gait Psych:  Tearful but appropriate       Assessment & Plan:   Weight gain  Unspecified hypothyroidism  POLYCYSTIC OVARIAN DISEASE No Follow-up on file.

## 2014-03-02 NOTE — Patient Instructions (Signed)
It was great to see you.  Try flexeril as needed at bedtime. You can take Alleve with food.  I will call you with your lab results.

## 2014-03-02 NOTE — Progress Notes (Signed)
Pre visit review using our clinic review tool, if applicable. No additional management support is needed unless otherwise documented below in the visit note. 

## 2014-03-02 NOTE — Assessment & Plan Note (Signed)
Discussed weight loss plan. She is not interested in referral to nutritionist or weight loss medication at this time- cost is an issue since her husband has been out of work.  She feels this is due to her thyroid.  See below.

## 2014-03-02 NOTE — Assessment & Plan Note (Signed)
Explained to MS. Melanie Hanson that I cannot adjust her meds based on her stating that it was abnormal at her OBGYN- I need these records.  I suggested we get a copy of the results but she prefers to recheck her thyroid studies here today.  Orders Placed This Encounter  Procedures  . TSH  . T4, Free  . T3, free

## 2014-03-02 NOTE — Assessment & Plan Note (Signed)
  For acute pain, rest, intermittent application of cold packs (later, may switch to heat, but do not sleep on heating pad), analgesics and muscle relaxants (eRx sent for flexeril) are recommended. Discussed longer term treatment plan of prn NSAID's and discussed a home back care exercise program with flexion exercise routine. Proper lifting with avoidance of heavy lifting discussed. Consider Physical Therapy and XRay studies if not improving. Call or return to clinic prn if these symptoms worsen or fail to improve as anticipated.

## 2015-09-15 ENCOUNTER — Encounter: Payer: Self-pay | Admitting: Primary Care

## 2015-09-15 ENCOUNTER — Ambulatory Visit (INDEPENDENT_AMBULATORY_CARE_PROVIDER_SITE_OTHER): Payer: BLUE CROSS/BLUE SHIELD | Admitting: Primary Care

## 2015-09-15 ENCOUNTER — Ambulatory Visit (INDEPENDENT_AMBULATORY_CARE_PROVIDER_SITE_OTHER)
Admission: RE | Admit: 2015-09-15 | Discharge: 2015-09-15 | Disposition: A | Discharge status: Home / Self Care | Payer: BLUE CROSS/BLUE SHIELD | Source: Ambulatory Visit | Attending: Primary Care | Admitting: Primary Care

## 2015-09-15 ENCOUNTER — Ambulatory Visit: Payer: Self-pay | Admitting: Family Medicine

## 2015-09-15 ENCOUNTER — Telehealth: Payer: Self-pay | Admitting: Primary Care

## 2015-09-15 ENCOUNTER — Other Ambulatory Visit: Payer: Self-pay | Admitting: Primary Care

## 2015-09-15 VITALS — BP 138/82 | HR 72 | Temp 98.0°F | Ht 68.25 in | Wt 251.8 lb

## 2015-09-15 DIAGNOSIS — M25532 Pain in left wrist: Secondary | ICD-10-CM

## 2015-09-15 DIAGNOSIS — T148XXA Other injury of unspecified body region, initial encounter: Secondary | ICD-10-CM

## 2015-09-15 DIAGNOSIS — M79642 Pain in left hand: Secondary | ICD-10-CM

## 2015-09-15 NOTE — Telephone Encounter (Signed)
Correction:  Will have her follow up with Dr. Patsy Lager in 1 week rather than sending to ortho. Will you please schedule? Thanks.

## 2015-09-15 NOTE — Progress Notes (Signed)
   Subjective:    Patient ID: Melanie Hanson, female    DOB: [birth year], 45 y.o.   MRN: [id number]  HPI  Mr. Tuel is a 45 year old female who presents today with a chief complaint of hand pain. Her pain is located to the left wrist, 4th and 5th digits and has been present since falling last night. She accidentally slipped and fell on her outstretched hand last night. She's noticed increased swelling and brising since her fall last night. She's taken tylenol and has been icing her fingers with some reduction in pain. Denies numbness/tinlging.  Review of Systems  Musculoskeletal: Positive for joint swelling and arthralgias.  Skin: Positive for color change.  Neurological: Negative for numbness.       Past Medical History  Diagnosis Date  . Thyroid disease   . PCOS (polycystic ovarian syndrome)   . Palpitations   . Hypothyroidism     Social History   Social History  . Marital Status: Married    Spouse Name: N/A  . Number of Children: N/A  . 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 Smoking status: Never Smoker   . Smokeless tobacco: Not on file  . Alcohol Use: Yes  . Drug Use: No  . Sexual Activity: Not on file   Other Topics Concern  . Not on file   Social History Narrative   Lives with husband in [place]    Past Surgical History  Procedure Laterality Date  . Tonsillectomy      Family History  Problem Relation Age of Onset  . Hypertension Father   . Diabetes Paternal Grandfather     No Known Allergies  Current Outpatient Prescriptions on File Prior to Visit  Medication Sig Dispense Refill  . levothyroxine (SYNTHROID, LEVOTHROID) 88 MCG tablet Take 88 mcg by mouth daily.      . Multiple Vitamin (MULITIVITAMIN WITH MINERALS) TABS Take 1 tablet by mouth daily. Reported on 09/15/2015     No current facility-administered medications on file prior to visit.    BP 138/82 mmHg  Pulse 72   Temp(Src) 98 F (36.7 C) (Oral)  Ht 5' 8.25" (1.734 m)  Wt 251 lb 12.8 oz (114.216 kg)  BMI 37.99 kg/m2  SpO2 98%  LMP 09/11/2015    Objective:   Physical Exam  Constitutional: She appears well-nourished.  Cardiovascular: Normal rate and regular rhythm.   Pulses:      Radial pulses are 2+ on the right side, and 2+ on the left side.  Pulmonary/Chest: Effort normal and breath sounds normal.  Musculoskeletal:       Left wrist: She exhibits decreased range of motion, tenderness and swelling. She exhibits no deformity.  No obvious deformity, however, moderate swelling. Decrease in ROM to left 5th digit, good strength, however painful.  Skin: Skin is warm and dry.  Moderate swelling to left 5th digit. Mild bruising anterior side.          Assessment & Plan:  Hand Pain:  Located to left hand since FOOSH last night. Exam with moderate swelling to 5th digit and mild swelling to wrist. xrays of wrist and digits taken today and are pending. Splint applied to 5th digit and wrist. Ibuprofen, ice, rest. Will refer if necessary.

## 2015-09-15 NOTE — Patient Instructions (Signed)
Complete xray(s) prior to leaving today. I will notify you of your results once received.  Continue to ice and elevate your wrist and hand. Try taking ibuprofen 600 mg three times daily as needed for pain and inflammation.   I will be in touch with you later today regarding your xray results.  It was a pleasure meeting you!

## 2015-09-15 NOTE — Telephone Encounter (Signed)
Called patient and follow up is schedule on 09/21/15.

## 2015-09-21 ENCOUNTER — Encounter: Payer: Self-pay | Admitting: Family Medicine

## 2015-09-21 ENCOUNTER — Ambulatory Visit (INDEPENDENT_AMBULATORY_CARE_PROVIDER_SITE_OTHER): Payer: BLUE CROSS/BLUE SHIELD | Admitting: Family Medicine

## 2015-09-21 VITALS — BP 140/84 | HR 66 | Temp 98.2°F | Ht 68.25 in | Wt 250.2 lb

## 2015-09-21 DIAGNOSIS — S62607D Fracture of unspecified phalanx of left little finger, subsequent encounter for fracture with routine healing: Secondary | ICD-10-CM

## 2015-09-21 NOTE — Progress Notes (Signed)
Pre visit review using our clinic review tool, if applicable. No additional management support is needed unless otherwise documented below in the visit note. 

## 2015-09-21 NOTE — Progress Notes (Signed)
Dr. Karleen Hampshire T. Markala Sitts, MD, CAQ Sports Medicine [facility] Sports [facility] [address] [place], [address] Phone: [phone] Fax: [phone]  09/21/2015  Patient: Melanie Hanson, MRN: [id number], DOB: Nov 30, 1970, 45 y.o.  Primary Physician:  Ruthe Mannan, MD   Chief Complaint  Patient presents with  . Follow-up    Left Pinky Fracture   Subjective:   Melanie Hanson is a 45 y.o. very pleasant female patient who presents with the following:  DOI 09/14/2015  The patient describes it as an where she fell backwards and landed on both hands in an outstretched position.  Initially she was most concerned about her wrists, but both of her wrists are completely recovered down have no pain at all and full range of motion.  She saw Mrs. Clark on September 15, 2015, and an avulsion fracture was seen on her left fifth phalanx.  There is some bruising and pain persistently, but flexion and extension is still preserved.  She was placed in an aluminum splint is here to follow-up with me regarding this injury.  Occupation: Interior and spatial designer  Past Medical History, Surgical History, Social History, Family History, Problem List, Medications, and Allergies have been reviewed and updated if relevant.  Patient Active Problem List   Diagnosis Date Noted  . Weight gain 03/02/2014  . Back injury 03/02/2014  . Influenza with respiratory manifestations 10/12/2011  . ANEMIA, NORMOCYTIC 08/30/2009  . MENORRHAGIA 08/26/2009  . Unspecified hypothyroidism 05/05/2009  . POLYCYSTIC OVARIAN DISEASE 05/05/2009  . AMENORRHEA 05/05/2009  . FATIGUE 05/05/2009  . PALPITATIONS 05/05/2009    Past Medical History  Diagnosis Date  . Thyroid disease   . PCOS (polycystic ovarian syndrome)   . Palpitations   . Hypothyroidism     Past Surgical History  Procedure Laterality Date  . Tonsillectomy      Social History   Social History  . Marital Status: Married    Spouse Name: N/A  . Number of Children:  N/A  . 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 Smoking status: Never Smoker   . Smokeless tobacco: Never Used  . Alcohol Use: 0.0 oz/week    0 Standard drinks or equivalent per week  . Drug Use: No  . Sexual Activity: Not on file   Other Topics Concern  . Not on file   Social History Narrative   Lives with husband in [place]    Family History  Problem Relation Age of Onset  . Hypertension Father   . Diabetes Paternal Grandfather     No Known Allergies  Medication list reviewed and updated in full in [facility] [place] [facility].  GEN: No fevers, chills. Nontoxic. Primarily MSK c/o today. MSK: Detailed in the HPI GI: tolerating PO intake without difficulty Neuro: No numbness, parasthesias, or tingling associated. Otherwise the pertinent positives of the ROS are noted above.   Objective:   BP 140/84 mmHg  Pulse 66  Temp(Src) 98.2 F (36.8 C) (Oral)  Ht 5' 8.25" (1.734 m)  Wt 250 lb 4 oz (113.513 kg)  BMI 37.75 kg/m2  LMP 09/11/2015   GEN: WDWN, NAD, Non-toxic, Alert & Oriented x 3 HEENT: Atraumatic, Normocephalic.  Ears and Nose: No external deformity. EXTR: No clubbing/cyanosis/edema NEURO: Normal gait.  PSYCH: Normally interactive. Conversant. Not depressed or anxious appearing.  Calm demeanor.   L hand Ecchymosis or edema: mild 5th bruising ROM wrist/hand/digits: full  Carpals, MCP's,  digits: NT Distal Ulna and Radius: NT Flexion and ext at MCP, PIP, and DIP preserved at 5th No instability Cysts/nodules: neg Digit triggering: neg Finkelstein's test: neg Snuffbox tenderness: neg Scaphoid tubercle: NT Resisted supination: NT Full composite fist, no malrotation Grip, all digits: 5/5 str DIPJT: NT PIP JT: NT MCP JT: NT No tenosynovitis Axial load test: neg Atrophy: neg  Hand sensation: intact   Radiology: Dg Wrist Complete Left  09/15/2015  CLINICAL DATA:  Pain, fell on hand last  night EXAM: LEFT WRIST - COMPLETE 3+ VIEW COMPARISON:  None. FINDINGS: The distal radius and ulna appear intact. The radiocarpal joint space is normal. The carpal bones are normal position. No acute fracture is seen. IMPRESSION: Negative. Electronically Signed   By: Dwyane Dee M.D.   On: 09/15/2015 12:06   Dg Hand Complete Left  09/15/2015  CLINICAL DATA:  Larey Seat on hand last night with pain EXAM: LEFT HAND - COMPLETE 3+ VIEW COMPARISON:  None. FINDINGS: There is a minimally displaced fracture through the base of the middle phalanx of the left fifth digit on the dorsal aspect. There are degenerative changes throughout the DIP joints. However no other abnormality is seen. IMPRESSION: Avulsion fracture from the base of the middle phalanx of the left fifth digit along the dorsal aspect. Electronically Signed   By: Dwyane Dee M.D.   On: 09/15/2015 12:07    Assessment and Plan:   Closed fracture of phalanx of left little finger with routine healing  Small avulsion at the dorsal aspect of the base of the middle phalanx, doing well clinically.   Removed splint, and I showed her how to appropriately tape to buddy tape with [employer] to use 4th as a splint, which she found comfortable. This should be sufficient. Continue to buddy tape the next 2 1/2 - 3 weeks.  I appreciate the opportunity to evaluate this very friendly patient. If you have any question regarding her care or prognosis, do not hesitate to ask.   Follow-up: prn  Signed,  Daoud Lobue T. Ford Peddie, MD   Patient's Medications  New Prescriptions   No medications on file  Previous Medications   LEVOTHYROXINE (SYNTHROID, LEVOTHROID) 88 MCG TABLET    Take 88 mcg by mouth daily.     MULTIPLE VITAMIN (MULITIVITAMIN WITH MINERALS) TABS    Take 1 tablet by mouth daily. Reported on 09/15/2015  Modified Medications   No medications on file  Discontinued Medications   METFORMIN (GLUCOPHAGE) 1000 MG TABLET

## 2016-04-27 ENCOUNTER — Encounter: Payer: Self-pay | Admitting: Family Medicine

## 2016-04-27 ENCOUNTER — Telehealth: Payer: Self-pay | Admitting: Family Medicine

## 2016-04-27 ENCOUNTER — Ambulatory Visit (INDEPENDENT_AMBULATORY_CARE_PROVIDER_SITE_OTHER): Payer: BLUE CROSS/BLUE SHIELD | Admitting: Family Medicine

## 2016-04-27 VITALS — BP 158/90 | HR 76 | Temp 98.0°F | Wt 247.8 lb

## 2016-04-27 DIAGNOSIS — R0789 Other chest pain: Secondary | ICD-10-CM | POA: Diagnosis not present

## 2016-04-27 DIAGNOSIS — I10 Essential (primary) hypertension: Secondary | ICD-10-CM | POA: Diagnosis not present

## 2016-04-27 LAB — COMPREHENSIVE METABOLIC PANEL
ALBUMIN: 4.1 g/dL (ref 3.5–5.2)
ALT: 14 U/L (ref 0–35)
AST: 13 U/L (ref 0–37)
Alkaline Phosphatase: 76 U/L (ref 39–117)
BUN: 8 mg/dL (ref 6–23)
CALCIUM: 9.5 mg/dL (ref 8.4–10.5)
CHLORIDE: 104 meq/L (ref 96–112)
CO2: 29 meq/L (ref 19–32)
Creatinine, Ser: 0.69 mg/dL (ref 0.40–1.20)
GFR: 98.24 mL/min (ref >60.00)
Glucose, Bld: 90 mg/dL (ref 70–99)
POTASSIUM: 4 meq/L (ref 3.5–5.1)
Sodium: 138 mEq/L (ref 135–145)
Total Bilirubin: 0.4 mg/dL (ref 0.2–1.2)
Total Protein: 7 g/dL (ref 6.0–8.3)

## 2016-04-27 LAB — CBC
HCT: 37.1 % (ref 36.0–46.0)
HEMOGLOBIN: 12.6 g/dL (ref 12.0–15.0)
MCHC: 34 g/dL (ref 30.0–36.0)
MCV: 85.1 fl (ref 78.0–100.0)
PLATELETS: 248 10*3/uL (ref 150.0–400.0)
RBC: 4.37 Mil/uL (ref 3.87–5.11)
RDW: 14.1 % (ref 11.5–15.5)
WBC: 7.2 10*3/uL (ref 4.0–10.5)

## 2016-04-27 LAB — TSH: TSH: 0.97 u[IU]/mL (ref 0.35–4.50)

## 2016-04-27 MED ORDER — AMLODIPINE BESYLATE 5 MG PO TABS: 5.0000 mg | ORAL_TABLET | Freq: Every day | ORAL | 3 refills | Status: DC

## 2016-04-27 NOTE — Telephone Encounter (Signed)
Pt already has appt with Dr Birdie SonsSonnenberg 09/*15/17 at 10:30. [facility]

## 2016-04-27 NOTE — Assessment & Plan Note (Signed)
Patient's blood pressure borderline elevated in the past though over the last week or so has become more elevated when checked in several different outings. Checked at the pharmacy today and elevated. Improved here though still above goal. Not in hypertensive urgency range. No prior history of hypertension. Patient did report some mild blurry vision with this though this has resolved and her vision today is normal. She had some chest pain 5 days ago that occurred around the death of her cat. This was overall atypical and she has no cardiac history and this was most likely related to anxiety and stress. EKG done today is reassuring with stable T-wave inversions from previously. No new changes on EKG. Discussed all this with patient. We will start on amlodipine. We will obtain lab work as outlined below to evaluate for end organ damage and other causes of elevated blood pressure. She'll follow-up with her PCP as scheduled on Monday. She is given return precautions.

## 2016-04-27 NOTE — Progress Notes (Signed)
Tommi Rumps, MD Phone: [phone]  Melanie Hanson is a 45 y.o. female who presents today for same-day visit.  Patient reports recently having elevated blood pressure. Notes her friend took it Wednesday of this week and it was 180/90. Notes last night was 177/112. She checked it this morning at the pharmacy and it was 185/94. She notes no symptoms at this time. She does state she had some mild blurry vision yesterday. No history of vision issues. Vision checked today and was normal. Had some chest pain on Sep 25, 2022 after her cat died that she described as a central aching discomfort. No recurrence. No radiation, exertional component, shortness of breath, or diaphoresis. She's had no numbness, weakness, or headaches. She overall feels tired. She's had significant stressors over the last week or so as her uncle had a bypass surgery and she's been taking care of her grandmother with this. Also lots of stress at work as she is only person that works in her household.  PMH: nonsmoker.   ROS see history of present illness  Objective  Physical Exam Vitals:   04/27/16 1012 04/27/16 1027  BP: (!) 162/98 (!) 158/90  Pulse: 76   Temp: 98 F (36.7 C)     BP Readings from Last 3 Encounters:  04/27/16 (!) 158/90  09/21/15 140/84  09/15/15 138/82   Wt Readings from Last 3 Encounters:  04/27/16 247 lb 12.8 oz (112.4 kg)  09/21/15 250 lb 4 oz (113.5 kg)  09/15/15 251 lb 12.8 oz (114.2 kg)    Physical Exam  Constitutional: No distress.  HENT:  Head: Normocephalic and atraumatic.  Mouth/Throat: Oropharynx is clear and moist. No oropharyngeal exudate.  Eyes: Conjunctivae are normal. Pupils are equal, round, and reactive to light.  Ophthalmoscope completed with no apparent vascular or optic nerve abnormalities  Cardiovascular: Normal rate, regular rhythm and normal heart sounds.   Pulmonary/Chest: Effort normal and breath sounds normal.  Musculoskeletal: She exhibits no edema.    Neurological: She is alert.  CN 2-12 intact, 5/5 strength in bilateral biceps, triceps, grip, quads, hamstrings, plantar and dorsiflexion, sensation to light touch intact in bilateral UE and LE, normal gait, 2+ patellar reflexes  Skin: Skin is warm and dry. She is not diaphoretic.   EKG: Normal sinus rhythm, rate 63, inverted T waves V1 through V3 that are stable compared to EKG from 10/16/11, no apparent ischemic changes  Assessment/Plan: Please see individual problem list.  Essential hypertension Patient's blood pressure borderline elevated in the past though over the last week or so has become more elevated when checked in several different outings. Checked at the pharmacy today and elevated. Improved here though still above goal. Not in hypertensive urgency range. No prior history of hypertension. Patient did report some mild blurry vision with this though this has resolved and her vision today is normal. She had some chest pain 5 days ago that occurred around the death of her cat. This was overall atypical and she has no cardiac history and this was most likely related to anxiety and stress. EKG done today is reassuring with stable T-wave inversions from previously. No new changes on EKG. Discussed all this with patient. We will start on amlodipine. We will obtain lab work as outlined below to evaluate for end organ damage and other causes of elevated blood pressure. She'll follow-up with her PCP as scheduled on 2022-09-25. She is given return precautions.   Orders Placed This Encounter  Procedures  . Comp Met (CMET)  . CBC  .  TSH  . EKG 12-Lead    Meds ordered this encounter  Medications  . amLODipine (NORVASC) 5 MG tablet    Sig: Take 1 tablet (5 mg total) by mouth daily.    Dispense:  30 tablet    Refill:  Kieler, MD [place]

## 2016-04-27 NOTE — Progress Notes (Signed)
Pre visit review using our clinic review tool, if applicable. No additional management support is needed unless otherwise documented below in the visit note. 

## 2016-04-27 NOTE — Telephone Encounter (Signed)
[facility] Day - Client TELEPHONE ADVICE RECORD [facility] Patient Name: Melanie Hanson DOB: 09-18-1970 Initial Comment Caller States her BP is 185/94, has an appointment for Monday but is worried she should be seen earlier, yesterday had blurred vision Nurse Assessment Nurse: Debera Latalston, RN, Tinnie GensJeffrey Date/Time Lamount Cohen(Eastern Time): 04/27/2016 9:07:15 AM Confirm and document reason for call. If symptomatic, describe symptoms. You must click the next button to save text entered. ---Caller States her B/P is 185/94, has an appointment for Monday but is worried she should be seen earlier, yesterday had blurred vision. No headache. Has the patient traveled out of the country within the last 30 days? ---No Does the patient have any new or worsening symptoms? ---Yes Will a triage be completed? ---Yes Related visit to physician within the last 2 weeks? ---No Does the PT have any chronic conditions? (i.e. diabetes, asthma, etc.) ---Yes List chronic conditions. ---hypothyroid Is the patient pregnant or possibly pregnant? (Ask all females between the ages of 412-55) ---No Is this a behavioral health or substance abuse call? ---No Guidelines Guideline Title Affirmed Question Affirmed Notes High Blood Pressure BP # 160/100 Final Disposition User See PCP When Office is Open (within 3 days) Debera Latalston, RN, Tinnie GensJeffrey Disagree/Comply: Comply

## 2016-04-27 NOTE — Telephone Encounter (Signed)
Sent to [facility] in error by [facility]

## 2016-04-27 NOTE — Patient Instructions (Signed)
Nice to meet you. We'll start you on amlodipine once daily for your blood pressure. We will check some lab work today. Please keep your follow-up on Monday with your PCP. If you develop chest pain, shortness of breath, vision changes, numbness, weakness, headache, or any new or change in symptoms please seek medical attention. Your blood pressure should remain less than 180/110. Your goal blood pressures less than 140/90.

## 2016-04-28 NOTE — Telephone Encounter (Signed)
Seen in the office

## 2016-04-30 ENCOUNTER — Encounter: Payer: Self-pay | Admitting: Family Medicine

## 2016-04-30 ENCOUNTER — Ambulatory Visit (INDEPENDENT_AMBULATORY_CARE_PROVIDER_SITE_OTHER): Payer: BLUE CROSS/BLUE SHIELD | Admitting: Family Medicine

## 2016-04-30 VITALS — BP 142/80 | HR 67 | Temp 98.3°F | Wt 248.5 lb

## 2016-04-30 DIAGNOSIS — I10 Essential (primary) hypertension: Secondary | ICD-10-CM | POA: Diagnosis not present

## 2016-04-30 DIAGNOSIS — F4323 Adjustment disorder with mixed anxiety and depressed mood: Secondary | ICD-10-CM | POA: Insufficient documentation

## 2016-04-30 MED ORDER — ALPRAZOLAM 0.25 MG PO TABS: 0.2500 mg | ORAL_TABLET | Freq: Three times a day (TID) | ORAL | 0 refills | Status: DC | PRN

## 2016-04-30 NOTE — Assessment & Plan Note (Signed)
>  25 minutes spent in face to face time with patient, >50% spent in counselling or coordination of care discussing anxiety and HTN tx. Declining SSRI and psychotherapy at this time.  She feels stressors will be short lived. Rx given to pt for xanax to use as needed anxiety/insomnia. Discussed sedation and addiction precautions.

## 2016-04-30 NOTE — Patient Instructions (Signed)
Great to see you.  Please come see me next week.

## 2016-04-30 NOTE — Progress Notes (Signed)
Pre visit review using our clinic review tool, if applicable. No additional management support is needed unless otherwise documented below in the visit note. 

## 2016-04-30 NOTE — Assessment & Plan Note (Signed)
Already improving after 3 days. Advised to come see me next week for BP recheck.

## 2016-04-30 NOTE — Progress Notes (Signed)
Subjective:   Patient ID: Melanie Hanson, female    DOB: April 04, 1971, 45 y.o.   MRN: [id number]  Melanie Hanson is a pleasant 45 y.o. year old female who presents to clinic today with Follow-up (seen at [place] station 9/15 for HTN)  on 04/30/2016  HPI:  Saw Dr. Birdie Sons on 04/27/16 for elevated blood pressure.  Note reviewed. Per pt, her friend had been checking her BP and was 180/90 and 177/112.  She then went to a pharmacy where her BP was 185/94.  She was asymptomatic.  Under significant stress at home and at work.  Feels very tired.  BP at Dr. Purvis Sheffield office was 158/90. Started her on amlodipine 5 mg daily.  Still gets panic attacks.  Stressors have improved a little but still dealing with her husband's work injury and her cat dying recently.  Denies feeling depressed.  Not sleeping well.  Appetite good.  Current Outpatient Prescriptions on File Prior to Visit  Medication Sig Dispense Refill  . amLODipine (NORVASC) 5 MG tablet Take 1 tablet (5 mg total) by mouth daily. 30 tablet 3  . levothyroxine (SYNTHROID, LEVOTHROID) 88 MCG tablet Take 88 mcg by mouth daily.      . Multiple Vitamin (MULITIVITAMIN WITH MINERALS) TABS Take 1 tablet by mouth daily. Reported on 09/15/2015     No current facility-administered medications on file prior to visit.     No Known Allergies  Past Medical History:  Diagnosis Date  . Hypothyroidism   . Palpitations   . PCOS (polycystic ovarian syndrome)   . Thyroid disease     Past Surgical History:  Procedure Laterality Date  . TONSILLECTOMY      Family History  Problem Relation Age of Onset  . Hypertension Father   . Diabetes Paternal Grandfather     Social History   Social History  . Marital status: Married    Spouse name: N/A  . Number of children: N/A  . 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 Smoking status: Never Smoker  . Smokeless  tobacco: Never Used  . Alcohol use 0.0 oz/week  . Drug use: No  . Sexual activity: Not on file   Other Topics Concern  . Not on file   Social History Narrative   Lives with husband in [place]   The PMH, PSH, Social History, Family History, Medications, and allergies have been reviewed in [facility], and have been updated if relevant.    Review of Systems  Constitutional: Negative.   Eyes: Negative.   Respiratory: Negative.   Cardiovascular: Negative.   Neurological: Negative.   Psychiatric/Behavioral: Positive for sleep disturbance. Negative for behavioral problems, confusion, decreased concentration, dysphoric mood, hallucinations, self-injury and suicidal ideas. The patient is nervous/anxious. The patient is not hyperactive.   All other systems reviewed and are negative.      Objective:    BP (!) 150/90   Pulse 67   Temp 98.3 F (36.8 C) (Oral)   Wt 248 lb 8 oz (112.7 kg)   SpO2 98%   BMI 37.51 kg/m   Wt Readings from Last 3 Encounters:  04/30/16 248 lb 8 oz (112.7 kg)  04/27/16 247 lb 12.8 oz (112.4 kg)  09/21/15 250 lb 4 oz (113.5 kg)   BP Readings from Last 3 Encounters:  04/30/16 (!) 150/90  04/27/16 (!) 158/90  09/21/15 140/84    Physical Exam  Constitutional: She is  oriented to person, place, and time. She appears well-developed and well-nourished. No distress.  HENT:  Head: Normocephalic and atraumatic.  Eyes: Conjunctivae are normal.  Cardiovascular: Normal rate and regular rhythm.   Pulmonary/Chest: Effort normal and breath sounds normal.  Musculoskeletal: Normal range of motion.  Neurological: She is alert and oriented to person, place, and time. No cranial nerve deficit.  Skin: Skin is warm and dry. She is not diaphoretic.  Psychiatric: She has a normal mood and affect. Her behavior is normal. Judgment and thought content normal.  Nursing note and vitals reviewed.         Assessment & Plan:   Essential hypertension  Adjustment disorder with  mixed anxiety and depressed mood No Follow-up on file.

## 2016-05-10 ENCOUNTER — Ambulatory Visit (INDEPENDENT_AMBULATORY_CARE_PROVIDER_SITE_OTHER): Payer: BLUE CROSS/BLUE SHIELD | Admitting: Family Medicine

## 2016-05-10 ENCOUNTER — Encounter: Payer: Self-pay | Admitting: Family Medicine

## 2016-05-10 VITALS — BP 144/82 | HR 70 | Temp 98.0°F | Wt 247.0 lb

## 2016-05-10 DIAGNOSIS — I10 Essential (primary) hypertension: Secondary | ICD-10-CM

## 2016-05-10 DIAGNOSIS — F4323 Adjustment disorder with mixed anxiety and depressed mood: Secondary | ICD-10-CM | POA: Diagnosis not present

## 2016-05-10 NOTE — Patient Instructions (Signed)
Great to see you. Glad you had a great birthday.  Continue your blood pressure medication as it is and keep me up dated.

## 2016-05-10 NOTE — Assessment & Plan Note (Signed)
Situational and improving. Continue as needed xanax sparingly.

## 2016-05-10 NOTE — Progress Notes (Signed)
Pre visit review using our clinic review tool, if applicable. No additional management support is needed unless otherwise documented below in the visit note. 

## 2016-05-10 NOTE — Progress Notes (Signed)
Subjective:   Patient ID: Melanie Hanson, female    DOB: September 13, 1970, 45 y.o.   MRN: [id number]  Melanie Hanson is a pleasant 45 y.o. year old female who presents to clinic today with Follow-up  on 05/10/2016  HPI:  Here to follow up BP.  When I saw her last week, she was following up from a visit on 04/27/16 with Dr. Birdie Hanson for elevated blood pressures. He started her on amlodipine 5 mg daily.  She followed up with me 3 days later.  BP was better but still not at goal quite yet.  Was also complaining of significant anxiety.  She declined SSRI and psychotherapy but agreed to as needed xanax to use sparingly for panic attacks and insomnia.  Feels much better.    Has only had to use xanax once since I last saw her. Current Outpatient Prescriptions on File Prior to Visit  Medication Sig Dispense Refill  . ALPRAZolam (XANAX) 0.25 MG tablet Take 1 tablet (0.25 mg total) by mouth 3 (three) times daily as needed for anxiety. 30 tablet 0  . amLODipine (NORVASC) 5 MG tablet Take 1 tablet (5 mg total) by mouth daily. 30 tablet 3  . levothyroxine (SYNTHROID, LEVOTHROID) 88 MCG tablet Take 88 mcg by mouth daily.      . Multiple Vitamin (MULITIVITAMIN WITH MINERALS) TABS Take 1 tablet by mouth daily. Reported on 09/15/2015     No current facility-administered medications on file prior to visit.     No Known Allergies  Past Medical History:  Diagnosis Date  . Hypothyroidism   . Palpitations   . PCOS (polycystic ovarian syndrome)   . Thyroid disease     Past Surgical History:  Procedure Laterality Date  . TONSILLECTOMY      Family History  Problem Relation Age of Onset  . Hypertension Father   . Diabetes Paternal Grandfather     Social History   Social History  . Marital status: Married    Spouse name: N/A  . Number of children: N/A  . 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 Smoking status: Never  Smoker  . Smokeless tobacco: Never Used  . Alcohol use 0.0 oz/week  . Drug use: No  . Sexual activity: Not on file   Other Topics Concern  . Not on file   Social History Narrative   Lives with husband in [place]   The PMH, PSH, Social History, Family History, Medications, and allergies have been reviewed in [facility], and have been updated if relevant.   Review of Systems  Constitutional: Negative.   HENT: Negative.   Respiratory: Negative.   Cardiovascular: Negative.   Gastrointestinal: Negative.   Musculoskeletal: Negative.   Neurological: Negative.   Hematological: Negative.   Psychiatric/Behavioral: Negative.   All other systems reviewed and are negative.      Objective:    BP (!) 144/82   Pulse 70   Temp 98 F (36.7 C) (Oral)   Wt 247 lb (112 kg)   LMP 05/05/2016   SpO2 97%   BMI 37.28 kg/m  BP Readings from Last 3 Encounters:  05/10/16 (!) 144/82  04/30/16 (!) 142/80  04/27/16 (!) 158/90     Physical Exam  Constitutional: She is oriented to person, place, and time. She appears well-developed and well-nourished. No distress.  HENT:  Head: Normocephalic and atraumatic.  Eyes: Conjunctivae are normal.  Neck: Normal range  of motion.  Cardiovascular: Normal rate and regular rhythm.   Pulmonary/Chest: Effort normal.  Musculoskeletal: Normal range of motion.  Neurological: She is alert and oriented to person, place, and time. No cranial nerve deficit.  Skin: Skin is warm and dry. She is not diaphoretic.  Psychiatric: She has a normal mood and affect. Her behavior is normal. Judgment and thought content normal.  Nursing note and vitals reviewed.         Assessment & Plan:   Essential hypertension  Adjustment disorder with mixed anxiety and depressed mood No Follow-up on file.

## 2016-05-10 NOTE — Assessment & Plan Note (Signed)
Reasonable control. No changes made to rx. She will continue to monitor her blood pressure at home.

## 2017-09-23 ENCOUNTER — Telehealth: Payer: Self-pay

## 2017-09-23 NOTE — Telephone Encounter (Signed)
PLEASE NOTE: All timestamps contained within this report are represented as [place] Standard Time. CONFIDENTIALTY NOTICE: This fax transmission is intended only for the addressee. It contains information that is legally privileged, confidential or otherwise protected from use or disclosure. If you are not the intended recipient, you are strictly prohibited from reviewing, disclosing, copying using or disseminating any of this information or taking any action in reliance on or regarding this information. If you have received this fax in error, please notify us immediately by telephone so that we can arrange for its return to us. Phone: [phone], Toll-Free: [phone], Fax: [phone] Page: 1 of 2 Call Id: [id number] [facility] - Client TELEPHONE ADVICE RECORD [facility] Patient Name: Cyndal Juarbe Gender: Female DOB: 1971/03/12 Age: [age] Y 4 M 19 D Return Phone Number: [phone] (Primary) Address: City/State/Zip: [place] [id number] Client [facility] - Client [facility] Site  [facility] - Night Physician Ruthe MannanAron, Talia- MD Contact Type Call Who Is Calling Patient / Member / Family / Caregiver Call Type Triage / Clinical Relationship To Patient Self Return Phone Number [phone] (Primary) Chief Complaint Earache Reason for Call Symptomatic / Request for Health Information Initial Comment Caller states she has an earache. GOTO Facility Not Listed Local [facility] in [place] Translation No Nurse Assessment Nurse: Durwin NoraWinston, RN, Tresa EndoKelly Date/Time (Eastern Time): 09/21/2017 8:05:22 AM Confirm and document reason for call. If symptomatic, describe symptoms. ---Caller states she began having ear fullness yesterday and today it is still full and feels congested but it is achey. Does the patient have any new or worsening symptoms? ---Yes Will a triage be completed? ---Yes Related visit to physician within  the last 2 weeks? ---No Does the PT have any chronic conditions? (i.e. diabetes, asthma, etc.) ---No Is the patient pregnant or possibly pregnant? (Ask all females between the ages of 3612-55) ---No Is this a behavioral health or substance abuse call? ---No Guidelines Guideline Title Affirmed Question Affirmed Notes Nurse Date/Time (Eastern Time) Earache Earache (Exceptions: brief ear pain of < 60 minutes duration, earache occurring during air travel Foye ClockWinston, RN, Kelly 09/21/2017 8:07:51 AM Disp. Time Lamount Cohen(Eastern Time) Disposition Final User 09/21/2017 8:12:23 AM See Physician within 24 Hours Yes Durwin NoraWinston, RN, Adine MaduraKelly Caller Disagree/Comply Comply Caller Understands Yes PLEASE NOTE: All timestamps contained within this report are represented as [place] Standard Time. CONFIDENTIALTY NOTICE: This fax transmission is intended only for the addressee. It contains information that is legally privileged, confidential or otherwise protected from use or disclosure. If you are not the intended recipient, you are strictly prohibited from reviewing, disclosing, copying using or disseminating any of this information or taking any action in reliance on or regarding this information. If you have received this fax in error, please notify us immediately by telephone so that we can arrange for its return to us. Phone: [phone], Toll-Free: [phone], Fax: [phone] Page: 2 of 2 Call Id: [id number] PreDisposition Call Doctor Care Advice Given Per Guideline SEE PHYSICIAN WITHIN 24 HOURS: * IF OFFICE WILL BE CLOSED AND NO PCP TRIAGE: You need to be seen within the next 24 hours. An urgent care center is often a good source of care if your doctor's office is closed. PAIN MEDICINES: * For pain relief, take acetaminophen, ibuprofen, or naproxen. * Another choice is to take 600 mg (three 200 mg pills) by mouth every 8 hours as needed. * The most you should take each day is 1,200 mg (six 200 mg  pills a day), unless  your doctor has told you to take more. LOCAL COLD: * Apply a cold pack or a cold wet washcloth to outer ear for 20 min. to reduce pain while medicine takes effect (Note: some adults prefer local heat for 20 minutes) CALL BACK IF: * Severe pain persists over 2 hours after pain medicine * You become worse. Referrals GO TO FACILITY OTHER - SPECIFY

## 2017-09-24 NOTE — Telephone Encounter (Signed)
Called both caller ID number stated below and cell number in chart/LMOVM on cell stating that we had a 1:15pm appt left for today that she is welcome to have but will need for her to call back and also advised that [place] [place] [facility] [facility] and gave address and number asked to RTC to schedule/thx dmf

## 2018-10-13 LAB — HM PAP SMEAR: HM Pap smear: NEGATIVE

## 2018-10-14 LAB — LIPID PANEL
Cholesterol: 229 — AB (ref 0–200)
HDL: 44 (ref 35–70)
LDL Cholesterol: 158
Triglycerides: 135 (ref 40–160)

## 2018-10-14 LAB — BASIC METABOLIC PANEL
BUN: 8 (ref 4–21)
CO2: 22 (ref 13–22)
Chloride: 101 (ref 99–108)
Creatinine: 0.7 (ref 0.5–1.1)
Glucose: 99
Potassium: 4.6 (ref 3.4–5.3)
Sodium: 138 (ref 137–147)

## 2018-10-14 LAB — HEPATIC FUNCTION PANEL
ALT: 13 (ref 7–35)
AST: 13 (ref 13–35)
Alkaline Phosphatase: 103 (ref 25–125)
Bilirubin, Total: 0.3

## 2018-10-14 LAB — COMPREHENSIVE METABOLIC PANEL
Albumin: 4.3 (ref 3.5–5.0)
Calcium: 10.1 (ref 8.7–10.7)

## 2018-10-14 LAB — VITAMIN D 25 HYDROXY (VIT D DEFICIENCY, FRACTURES): Vit D, 25-Hydroxy: 12.1

## 2018-10-14 LAB — TSH: TSH: 2.64 (ref 0.41–5.90)

## 2019-08-11 ENCOUNTER — Ambulatory Visit: Payer: BLUE CROSS/BLUE SHIELD | Attending: Internal Medicine

## 2019-08-11 DIAGNOSIS — Z20822 Contact with and (suspected) exposure to covid-19: Secondary | ICD-10-CM

## 2019-08-13 LAB — NOVEL CORONAVIRUS, NAA: SARS-CoV-2, NAA: DETECTED — AB

## 2019-09-01 ENCOUNTER — Other Ambulatory Visit: Payer: Self-pay

## 2019-09-01 ENCOUNTER — Encounter: Payer: Self-pay | Admitting: Family Medicine

## 2019-09-01 ENCOUNTER — Ambulatory Visit (INDEPENDENT_AMBULATORY_CARE_PROVIDER_SITE_OTHER): Payer: 59 | Admitting: Family Medicine

## 2019-09-01 VITALS — BP 154/96 | HR 75 | Temp 98.0°F | Ht 67.5 in | Wt 239.2 lb

## 2019-09-01 DIAGNOSIS — R29898 Other symptoms and signs involving the musculoskeletal system: Secondary | ICD-10-CM | POA: Insufficient documentation

## 2019-09-01 DIAGNOSIS — M25512 Pain in left shoulder: Secondary | ICD-10-CM

## 2019-09-01 DIAGNOSIS — E039 Hypothyroidism, unspecified: Secondary | ICD-10-CM

## 2019-09-01 DIAGNOSIS — R350 Frequency of micturition: Secondary | ICD-10-CM | POA: Diagnosis not present

## 2019-09-01 DIAGNOSIS — I10 Essential (primary) hypertension: Secondary | ICD-10-CM

## 2019-09-01 DIAGNOSIS — M545 Low back pain, unspecified: Secondary | ICD-10-CM

## 2019-09-01 LAB — POC URINALSYSI DIPSTICK (AUTOMATED)
Bilirubin, UA: NEGATIVE
Blood, UA: NEGATIVE
Glucose, UA: NEGATIVE
Ketones, UA: NEGATIVE
Leukocytes, UA: NEGATIVE
Nitrite, UA: NEGATIVE
Protein, UA: NEGATIVE
Spec Grav, UA: <=1.005 — AB (ref 1.010–1.025)
Urobilinogen, UA: 0.2 E.U./dL
pH, UA: 6 (ref 5.0–8.0)

## 2019-09-01 NOTE — Assessment & Plan Note (Signed)
Unclear what is causing her symptoms or if they are connected. Wonder about medial nerve vs muscle related issue. Trial of PT and if not improved will likely consider imaging vs EMG if still having some weakness.

## 2019-09-01 NOTE — Assessment & Plan Note (Signed)
Pt notes normal blood work with GYN. Will get records. Never started treatment, notes nervous at the doctor. Advised home monitoring, improved on repeat. Return in 4 weeks - will get outside labs. Anticipating that she will likely benefit from medication - though is already working on diet/weight loss/exercise.

## 2019-09-01 NOTE — Patient Instructions (Signed)
#  Urinary symptoms - continue to monitor - Decrease water consumption by 10% - Urology referral if not improving  #Shoulder/Elbow/Wrist - Physical therapy for this   Your blood pressure high.   High blood pressure increases your risk for heart attack and stroke.    Please check your blood pressure 2-4 times a week.   To check your blood pressure 1) Sit in a quiet and relaxed place for 5 minutes 2) Make sure your feet are flat on the ground 3) Consider checking first thing in the morning   Normal blood pressure is less than 140/90 Ideally you blood pressure should be around 120/80  Other ways you can reduce your blood pressure:  1) Regular exercise -- Try to get 150 minutes (30 minutes, 5 days a week) of moderate to vigorous aerobic excercise -- Examples: brisk walking (2.5 miles per hour), water aerobics, dancing, gardening, tennis, biking slower than 10 miles per hour 2) DASH Diet - low fat meats, more fresh fruits and vegetables, whole grains, low salt 3) Quit smoking if you smoke 4) Loose 5-10% of your body weight

## 2019-09-01 NOTE — Progress Notes (Signed)
Subjective:     Melanie Hanson is a 49 y.o. female presenting for Transitions Of Care (C/o insomnia. ), Shoulder Pain (C/o left shoulder pain.  Started several mos ago.  Also, c/o left elbow and wrist pain. Worsened in last few weeks. ), and Back Pain (C/o right low back pain.  Also, c/o urinary frequency.  Sxs about 1 wk ago.  Tried drinking cranberry juice and increasing water intake. )     HPI   #Urinary symptoms - started 1 week ago - frequency - right low back pain - treatment: cranberry and water not sure if help - no burning - urinating a lot over night - no incontinent - stops drinking around 7-8 pm - avoids caffeine - no issues emptying the bladder - keeps waking up to pee overnight - no incontinence  #Shoulder Pain - worse when she resting from work with covid it got worse - started 4 months ago - posterior shoulder blade area and radiates to the front of the chest - travels down to the elbow and wrist - difficulty picking up heavy things - dropping things in that hand - worse with picking up anything up that is >5 lbs - worse with raising overhead - treatment: rest, advil w/ improvement - ice helps   #HTN - happens a lot at doctors offices - notes it was good at the walk-in clinic - came her straight from work - gets blood work with her OB/GYN - last was in March    Review of Systems  Genitourinary: Positive for frequency. Negative for difficulty urinating and dysuria.     Social History   Tobacco Use  Smoking Status Never Smoker  Smokeless Tobacco Never Used        Objective:    BP Readings from Last 3 Encounters:  09/01/19 (!) 154/96  05/10/16 (!) 144/82  04/30/16 (!) 142/80   Wt Readings from Last 3 Encounters:  09/01/19 239 lb 4 oz (108.5 kg)  05/10/16 247 lb (112 kg)  04/30/16 248 lb 8 oz (112.7 kg)    BP (!) 154/96 (BP Location: Right Arm, Patient Position: Sitting, Cuff Size: Large)   Pulse 75   Temp 98 F (36.7 C)  (Temporal)   Ht 5' 7.5" (1.715 m)   Wt 239 lb 4 oz (108.5 kg)   LMP 08/08/2019   SpO2 96%   BMI 36.92 kg/m    Physical Exam Constitutional:      General: She is not in acute distress.    Appearance: She is well-developed. She is not diaphoretic.  HENT:     Right Ear: External ear normal.     Left Ear: External ear normal.     Nose: Nose normal.  Eyes:     Conjunctiva/sclera: Conjunctivae normal.  Neck:     Comments: Negative spurling Cardiovascular:     Rate and Rhythm: Normal rate and regular rhythm.     Heart sounds: No murmur.  Pulmonary:     Effort: Pulmonary effort is normal. No respiratory distress.     Breath sounds: Normal breath sounds. No wheezing.  Musculoskeletal:     Cervical back: Normal range of motion. No rigidity or tenderness.     Comments: Left Shoulder:  Inspection: some swelling over the sternal-clavicular joint. Otherwise normal Palpation: TTP along the clavicle , posterior shoulder blade, deltoid and down the the biceps. No anterior biceps tendon TTP ROM: normal but with some discomfort with overhead, normal rotator cuff ROM Strength: triceps 5/5,  biceps 4/5 Left Elbow:  TTP along the lateral epicondyl.   Left Wrist Pain to the shoulder and weakness with resisted wrist extension.  Negative carpal tunnel testing Normal wrist flexion and interosseous strength testing  Skin:    General: Skin is warm and dry.     Capillary Refill: Capillary refill takes less than 2 seconds.  Neurological:     Mental Status: She is alert. Mental status is at baseline.  Psychiatric:        Mood and Affect: Mood normal.        Behavior: Behavior normal.           Assessment & Plan:   Problem List Items Addressed This Visit      Cardiovascular and Mediastinum   Essential hypertension    Pt notes normal blood work with GYN. Will get records. Never started treatment, notes nervous at the doctor. Advised home monitoring, improved on repeat. Return in 4 weeks -  will get outside labs. Anticipating that she will likely benefit from medication - though is already working on diet/weight loss/exercise.         Endocrine   Hypothyroidism    Per patient normal blood work with OB/GYN. Will obtain records. Continue replacement        Other   Urinary frequency    UA normal. Does drink >100 oz of water per day, however, this only started 1 week ago. Is already doing good habits to avoid nighttime urination. Advised watch and wait and urology referral placed if symptoms do not resolve over the next few days.       Relevant Orders   Ambulatory referral to Urology   Acute pain of left shoulder    Unclear what is causing her symptoms or if they are connected. Wonder about medial nerve vs muscle related issue. Trial of PT and if not improved will likely consider imaging vs EMG if still having some weakness.       Relevant Orders   Ambulatory referral to Physical Therapy   Wrist weakness   Relevant Orders   Ambulatory referral to Physical Therapy    Other Visit Diagnoses    Acute right-sided low back pain, unspecified whether sciatica present    -  Primary   Relevant Orders   POCT Urinalysis Dipstick (Automated) (Completed)     >45 minutes spent in total time with patient, including documentation and chart review, >50% spent in counselling or coordination of care    Return in about 4 weeks (around 09/29/2019) for Blood pressure and shoulder/.  Lynnda Child, MD

## 2019-09-01 NOTE — Assessment & Plan Note (Signed)
Per patient normal blood work with OB/GYN. Will obtain records. Continue replacement

## 2019-09-01 NOTE — Assessment & Plan Note (Signed)
UA normal. Does drink >100 oz of water per day, however, this only started 1 week ago. Is already doing good habits to avoid nighttime urination. Advised watch and wait and urology referral placed if symptoms do not resolve over the next few days.

## 2019-09-02 ENCOUNTER — Encounter: Payer: Self-pay | Admitting: Family Medicine

## 2019-09-15 ENCOUNTER — Encounter: Payer: Self-pay | Admitting: Family Medicine

## 2019-09-15 DIAGNOSIS — E785 Hyperlipidemia, unspecified: Secondary | ICD-10-CM | POA: Insufficient documentation

## 2019-09-30 ENCOUNTER — Encounter: Payer: Self-pay | Admitting: Urology

## 2019-09-30 ENCOUNTER — Other Ambulatory Visit: Payer: Self-pay

## 2019-09-30 ENCOUNTER — Ambulatory Visit
Admission: RE | Admit: 2019-09-30 | Discharge: 2019-09-30 | Disposition: A | Discharge status: Home / Self Care | Payer: 59 | Source: Ambulatory Visit | Attending: Urology | Admitting: Urology

## 2019-09-30 ENCOUNTER — Telehealth: Payer: Self-pay | Admitting: *Deleted

## 2019-09-30 ENCOUNTER — Ambulatory Visit (INDEPENDENT_AMBULATORY_CARE_PROVIDER_SITE_OTHER): Payer: 59 | Admitting: Urology

## 2019-09-30 VITALS — BP 186/102 | HR 72 | Ht 67.5 in | Wt 237.0 lb

## 2019-09-30 DIAGNOSIS — R350 Frequency of micturition: Secondary | ICD-10-CM

## 2019-09-30 DIAGNOSIS — G8929 Other chronic pain: Secondary | ICD-10-CM

## 2019-09-30 DIAGNOSIS — M545 Low back pain: Secondary | ICD-10-CM | POA: Diagnosis not present

## 2019-09-30 LAB — URINALYSIS, COMPLETE
Bilirubin, UA: NEGATIVE
Glucose, UA: NEGATIVE
Ketones, UA: NEGATIVE
Leukocytes,UA: NEGATIVE
Nitrite, UA: NEGATIVE
Protein,UA: NEGATIVE
RBC, UA: NEGATIVE
Specific Gravity, UA: <1.005 — ABNORMAL LOW (ref 1.005–1.030)
Urobilinogen, Ur: 0.2 mg/dL (ref 0.2–1.0)
pH, UA: 5.5 (ref 5.0–7.5)

## 2019-09-30 LAB — BLADDER SCAN AMB NON-IMAGING: Scan Result: 40

## 2019-09-30 LAB — MICROSCOPIC EXAMINATION
Bacteria, UA: NONE SEEN
RBC, Urine: NONE SEEN /hpf (ref 0–2)

## 2019-09-30 MED ORDER — OXYBUTYNIN CHLORIDE ER 10 MG PO TB24: 10.0000 mg | ORAL_TABLET | Freq: Every day | ORAL | 3 refills | Status: DC

## 2019-09-30 NOTE — Telephone Encounter (Addendum)
  Left VM with details, asked to return call with any questions.  ----- Message from Vanna Scotland, MD sent at 09/30/2019  3:45 PM EST ----- KUB is negative, no stones  Vanna Scotland, MD

## 2019-09-30 NOTE — Progress Notes (Signed)
09/30/2019 11:31 AM   Melanie Hanson November 07, 1970 [id number]  Referring provider: Lesleigh Noe, MD [place],  [place] [id number]  Chief Complaint  Patient presents with  . Urinary Frequency    HPI: 49 year old female who presents today for urinary frequency and back pain.  She reports that she has been having chronic low back pain that radiates to her buttock.  No alleviating or exacerbating symptoms.  This is going on for quite some time.  She is worried it might be kidney related.  She has no personal history of kidney stones.  No gross hematuria.  Pain does not lateralize.  In addition to the above, she reports that over the past several weeks, she has had increased urinary frequency without significant urgency or urge incontinence.  She does drink greater than 100 ounces of water per day.  She previously drank more than this trying to lose weight but has cut back to about 100 ounces per day.  This is not helped with her urinary symptoms.  She reports that she gets up 3-4 times at night to void.  She also reports that she has new onset restless leg and wonders if this could be related to everything else going on.  During the day, she voids up to every hour without significant urgency.  These are good size volumes.  Prior to January, this really was not an issue for her.  She was checked for urinary tract infection and found to have a negative urinalysis which was dilute by her primary care physician on 09/01/2019.  This prompted urologic referral.  No dysuria.  No incontinence.   PMH: Past Medical History:  Diagnosis Date  . Hypothyroidism   . Palpitations   . PCOS (polycystic ovarian syndrome)   . Thyroid disease     Surgical History: Past Surgical History:  Procedure Laterality Date  . TONSILLECTOMY      Home Medications:  Allergies as of 09/30/2019   No Known Allergies     Medication List       Accurate as of September 30, 2019 11:31 AM. If you  have any questions, ask your nurse or doctor.        levothyroxine 88 MCG tablet Commonly known as: SYNTHROID Take 88 mcg by mouth daily.   MISC NATURAL PRODUCTS PO Take by mouth. Plexus Meta Burn   multivitamin with minerals Tabs tablet Take 1 tablet by mouth daily. Reported on 09/15/2015   oxybutynin 10 MG 24 hr tablet Commonly known as: DITROPAN-XL Take 1 tablet (10 mg total) by mouth daily. Started by: Hollice Espy, MD       Allergies: No Known Allergies  Family History: Family History  Problem Relation Age of Onset  . Hypertension Father   . Diabetes Paternal Grandfather   . Arthritis Mother   . Arthritis Maternal Grandmother   . High blood pressure Maternal Grandmother   . Arthritis Paternal Grandmother   . High blood pressure Paternal Grandmother     Social History:  reports that she has never smoked. She has never used smokeless tobacco. She reports current alcohol use. She reports that she does not use drugs.   Physical Exam: BP (!) 186/102   Pulse 72   Ht 5' 7.5" (1.715 m)   Wt 237 lb (107.5 kg)   BMI 36.57 kg/m   Constitutional:  Alert and oriented, No acute distress. HEENT: [place] AT, moist mucus membranes.  Trachea midline, no masses. Cardiovascular: No clubbing,  cyanosis, or edema. Respiratory: Normal respiratory effort, no increased work of breathing. GU: Indicates tenderness across entire low back Skin: No rashes, bruises or suspicious lesions. Neurologic: Grossly intact, no focal deficits, moving all 4 extremities. Psychiatric: Normal mood and affect.  Laboratory Data: Lab Results  Component Value Date   WBC 7.2 04/27/2016   HGB 12.6 04/27/2016   HCT 37.1 04/27/2016   MCV 85.1 04/27/2016   PLT 248.0 04/27/2016    Lab Results  Component Value Date   CREATININE 0.7 10/14/2018   Urinalysis Urinalysis today is negative, see epic  Pertinent Imaging: Results for orders placed or performed in visit on 09/30/19  Bladder Scan (Post Void  Residual) in office  Result Value Ref Range   Scan Result 40     Assessment & Plan:    1. 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  We discussed oxybutynin 10 mg XL .  Possible side effects including dry eyes, dry mouth and constipation were discussed the side effects.  Plan to recheck her urinary symptoms in 3 months to reassess. - Urinalysis, Complete - Bladder Scan (Post Void Residual) in office - Abdomen 1 view (KUB); Future  2. Chronic midline low back pain without sciatica Unrelated to GU issues post on location and nature of pain  Offered KUB to rule out stones for reassurance, she is agreeable this plan   Return in about 3 months (around 12/28/2019) for recheck urinay symptoms with PA.  Vanna Scotland, MD  [facility] [address] [place], [place] [address] [phone]

## 2019-10-05 ENCOUNTER — Other Ambulatory Visit: Payer: Self-pay

## 2019-10-05 ENCOUNTER — Ambulatory Visit (INDEPENDENT_AMBULATORY_CARE_PROVIDER_SITE_OTHER): Payer: 59 | Admitting: Family Medicine

## 2019-10-05 ENCOUNTER — Encounter: Payer: Self-pay | Admitting: Family Medicine

## 2019-10-05 VITALS — BP 148/80 | HR 77 | Temp 98.7°F | Ht 67.5 in | Wt 235.0 lb

## 2019-10-05 DIAGNOSIS — I10 Essential (primary) hypertension: Secondary | ICD-10-CM

## 2019-10-05 DIAGNOSIS — E039 Hypothyroidism, unspecified: Secondary | ICD-10-CM | POA: Diagnosis not present

## 2019-10-05 DIAGNOSIS — L680 Hirsutism: Secondary | ICD-10-CM | POA: Diagnosis not present

## 2019-10-05 DIAGNOSIS — E282 Polycystic ovarian syndrome: Secondary | ICD-10-CM | POA: Diagnosis not present

## 2019-10-05 LAB — CBC
HCT: 34.3 % — ABNORMAL LOW (ref 36.0–46.0)
Hemoglobin: 10.9 g/dL — ABNORMAL LOW (ref 12.0–15.0)
MCHC: 31.6 g/dL (ref 30.0–36.0)
MCV: 77 fl — ABNORMAL LOW (ref 78.0–100.0)
Platelets: 252 10*3/uL (ref 150.0–400.0)
RBC: 4.46 Mil/uL (ref 3.87–5.11)
RDW: 15.9 % — ABNORMAL HIGH (ref 11.5–15.5)
WBC: 6.6 10*3/uL (ref 4.0–10.5)

## 2019-10-05 LAB — LIPID PANEL
Cholesterol: 235 mg/dL — ABNORMAL HIGH (ref 0–200)
HDL: 52.1 mg/dL (ref >39.00)
LDL Cholesterol: 163 mg/dL — ABNORMAL HIGH (ref 0–99)
NonHDL: 183.31
Total CHOL/HDL Ratio: 5
Triglycerides: 103 mg/dL (ref 0.0–149.0)
VLDL: 20.6 mg/dL (ref 0.0–40.0)

## 2019-10-05 LAB — COMPREHENSIVE METABOLIC PANEL
ALT: 11 U/L (ref 0–35)
AST: 13 U/L (ref 0–37)
Albumin: 4.5 g/dL (ref 3.5–5.2)
Alkaline Phosphatase: 80 U/L (ref 39–117)
BUN: 12 mg/dL (ref 6–23)
CO2: 26 mEq/L (ref 19–32)
Calcium: 10.5 mg/dL (ref 8.4–10.5)
Chloride: 105 mEq/L (ref 96–112)
Creatinine, Ser: 0.71 mg/dL (ref 0.40–1.20)
GFR: 88.08 mL/min (ref >60.00)
Glucose, Bld: 108 mg/dL — ABNORMAL HIGH (ref 70–99)
Potassium: 4.5 mEq/L (ref 3.5–5.1)
Sodium: 138 mEq/L (ref 135–145)
Total Bilirubin: 0.4 mg/dL (ref 0.2–1.2)
Total Protein: 7 g/dL (ref 6.0–8.3)

## 2019-10-05 LAB — TSH: TSH: 1.28 u[IU]/mL (ref 0.35–4.50)

## 2019-10-05 MED ORDER — SPIRONOLACTONE 50 MG PO TABS: 50.0000 mg | ORAL_TABLET | Freq: Two times a day (BID) | ORAL | 1 refills | Status: DC

## 2019-10-05 NOTE — Patient Instructions (Signed)
Keep working on diet and exercise  1) Start Spironolactone twice daily - if lightheadedness or dizziness let me know and we will plan to lower the dose  Return in 1 month  Labs today

## 2019-10-05 NOTE — Assessment & Plan Note (Signed)
BP elevated. As she has been doing lifestyle for a while w/o significant improvement will start medication and reassess if she has improved BP. Initially planned for amlodipine but then she voiced using spironolactone in the past and wanted to try this again. Discussed not a typical first choice but as dual purpose will start for hirsutism and see if BP improves.

## 2019-10-05 NOTE — Assessment & Plan Note (Signed)
Cont levothyroxine. Annual labs.

## 2019-10-05 NOTE — Progress Notes (Signed)
Subjective:     Melanie Hanson is a 49 y.o. female presenting for Follow-up (Hypertension)     HPI  #HTN - no cp, vision changes - has not taking medication - has been exercising and working on weight loss - would like to lose 30 lbs  #shoulder pain - has therapy next monday  Review of Systems   09/01/2019: Clinic - HTN - get labs, lifestyle Social History   Tobacco Use  Smoking Status Never Smoker  Smokeless Tobacco Never Used        Objective:    BP Readings from Last 3 Encounters:  10/05/19 (!) 148/80  09/30/19 (!) 186/102  09/01/19 (!) 154/96   Wt Readings from Last 3 Encounters:  10/05/19 235 lb (106.6 kg)  09/30/19 237 lb (107.5 kg)  09/01/19 239 lb 4 oz (108.5 kg)    BP (!) 148/80 (BP Location: Left Arm, Patient Position: Sitting, Cuff Size: Large)   Pulse 77   Temp 98.7 F (37.1 C) (Temporal)   Ht 5' 7.5" (1.715 m)   Wt 235 lb (106.6 kg)   SpO2 97%   BMI 36.26 kg/m    Physical Exam Constitutional:      General: She is not in acute distress.    Appearance: She is well-developed. She is not diaphoretic.  HENT:     Right Ear: External ear normal.     Left Ear: External ear normal.     Nose: Nose normal.  Eyes:     Conjunctiva/sclera: Conjunctivae normal.  Cardiovascular:     Rate and Rhythm: Normal rate and regular rhythm.     Heart sounds: No murmur.  Pulmonary:     Effort: Pulmonary effort is normal. No respiratory distress.     Breath sounds: Normal breath sounds. No wheezing.  Musculoskeletal:     Cervical back: Neck supple.  Skin:    General: Skin is warm and dry.     Capillary Refill: Capillary refill takes less than 2 seconds.  Neurological:     Mental Status: She is alert. Mental status is at baseline.  Psychiatric:        Mood and Affect: Mood normal.        Behavior: Behavior normal.           Assessment & Plan:   Problem List Items Addressed This Visit      Cardiovascular and Mediastinum   Essential  hypertension - Primary    BP elevated. As she has been doing lifestyle for a while w/o significant improvement will start medication and reassess if she has improved BP. Initially planned for amlodipine but then she voiced using spironolactone in the past and wanted to try this again. Discussed not a typical first choice but as dual purpose will start for hirsutism and see if BP improves.        Relevant Medications   spironolactone (ALDACTONE) 50 MG tablet   Other Relevant Orders   Lipid panel   TSH   Comprehensive metabolic panel   CBC     Endocrine   Hypothyroidism    Cont levothyroxine. Annual labs.       Relevant Orders   TSH   POLYCYSTIC OVARIAN DISEASE    Restart spironolactone for hirsutism       Relevant Medications   spironolactone (ALDACTONE) 50 MG tablet     Musculoskeletal and Integument   Hirsutism   Relevant Medications   spironolactone (ALDACTONE) 50 MG tablet  Return in about 4 weeks (around 11/02/2019).  Lesleigh Noe, MD

## 2019-10-05 NOTE — Assessment & Plan Note (Signed)
Restart spironolactone for hirsutism

## 2019-10-06 ENCOUNTER — Ambulatory Visit: Payer: Self-pay | Admitting: Urology

## 2019-10-06 ENCOUNTER — Other Ambulatory Visit (INDEPENDENT_AMBULATORY_CARE_PROVIDER_SITE_OTHER): Payer: 59

## 2019-10-06 ENCOUNTER — Encounter: Payer: Self-pay | Admitting: Family Medicine

## 2019-10-06 DIAGNOSIS — D509 Iron deficiency anemia, unspecified: Secondary | ICD-10-CM | POA: Diagnosis not present

## 2019-10-07 ENCOUNTER — Encounter: Payer: Self-pay | Admitting: Family Medicine

## 2019-10-07 LAB — FERRITIN: Ferritin: 3.7 ng/mL — ABNORMAL LOW (ref 10.0–291.0)

## 2019-10-12 ENCOUNTER — Ambulatory Visit: Payer: 59 | Admitting: Physical Therapy

## 2019-10-13 ENCOUNTER — Ambulatory Visit: Payer: 59 | Admitting: Physical Therapy

## 2019-10-14 ENCOUNTER — Encounter: Payer: 59 | Admitting: Physical Therapy

## 2019-10-15 ENCOUNTER — Ambulatory Visit: Payer: 59 | Admitting: Physical Therapy

## 2019-10-19 ENCOUNTER — Encounter: Payer: 59 | Admitting: Physical Therapy

## 2019-10-21 ENCOUNTER — Encounter: Payer: 59 | Admitting: Physical Therapy

## 2019-10-26 ENCOUNTER — Encounter: Payer: 59 | Admitting: Physical Therapy

## 2019-10-28 ENCOUNTER — Encounter: Payer: 59 | Admitting: Physical Therapy

## 2019-11-02 ENCOUNTER — Encounter: Payer: 59 | Admitting: Physical Therapy

## 2019-11-03 ENCOUNTER — Other Ambulatory Visit: Payer: Self-pay

## 2019-11-03 ENCOUNTER — Ambulatory Visit (INDEPENDENT_AMBULATORY_CARE_PROVIDER_SITE_OTHER): Payer: 59 | Admitting: Family Medicine

## 2019-11-03 ENCOUNTER — Encounter: Payer: Self-pay | Admitting: Family Medicine

## 2019-11-03 VITALS — BP 134/96 | HR 76 | Temp 98.3°F | Ht 67.5 in | Wt 234.0 lb

## 2019-11-03 DIAGNOSIS — D500 Iron deficiency anemia secondary to blood loss (chronic): Secondary | ICD-10-CM | POA: Diagnosis not present

## 2019-11-03 DIAGNOSIS — I10 Essential (primary) hypertension: Secondary | ICD-10-CM

## 2019-11-03 DIAGNOSIS — L680 Hirsutism: Secondary | ICD-10-CM

## 2019-11-03 DIAGNOSIS — E282 Polycystic ovarian syndrome: Secondary | ICD-10-CM | POA: Diagnosis not present

## 2019-11-03 LAB — BASIC METABOLIC PANEL
BUN: 12 mg/dL (ref 6–23)
CO2: 27 mEq/L (ref 19–32)
Calcium: 10.3 mg/dL (ref 8.4–10.5)
Chloride: 105 mEq/L (ref 96–112)
Creatinine, Ser: 0.76 mg/dL (ref 0.40–1.20)
GFR: 81.4 mL/min (ref >60.00)
Glucose, Bld: 99 mg/dL (ref 70–99)
Potassium: 4.3 mEq/L (ref 3.5–5.1)
Sodium: 136 mEq/L (ref 135–145)

## 2019-11-03 MED ORDER — SPIRONOLACTONE 100 MG PO TABS: 100.0000 mg | ORAL_TABLET | Freq: Two times a day (BID) | ORAL | 1 refills | Status: DC

## 2019-11-03 NOTE — Patient Instructions (Signed)
Hair growth - try taking 100 mg of Spironolactone in the morning and 50 mg at night. If still with hair growth can try 100 mg twice daily - labs today  Talk with GYN about periods

## 2019-11-03 NOTE — Assessment & Plan Note (Signed)
Pt with normal BP at home. Continue spironolactone. Labs with normal K.

## 2019-11-03 NOTE — Progress Notes (Signed)
Subjective:     Melanie Hanson is a 49 y.o. female presenting for Hypertension (follow up)     HPI  #HTN - has been checking at home with BP 130/80s - has been feeling fine - no cp, sob, ha - taking spironolactone w/o side effects  #hirsutisim - has slowed the growth  Was not able to go PT   #Iron deficiency anemia - heavy periods - taking iron table and feeling better  Review of Systems  10/05/2019: Clinic - HTN and PCOS/hirsutism - starting spironolactone. Iron deficiency anemia - starting iron  Social History   Tobacco Use  Smoking Status Never Smoker  Smokeless Tobacco Never Used        Objective:    BP Readings from Last 3 Encounters:  11/03/19 (!) 134/96  10/05/19 (!) 148/80  09/30/19 (!) 186/102   Wt Readings from Last 3 Encounters:  11/03/19 234 lb (106.1 kg)  10/05/19 235 lb (106.6 kg)  09/30/19 237 lb (107.5 kg)    BP (!) 134/96   Pulse 76   Temp 98.3 F (36.8 C)   Ht 5' 7.5" (1.715 m)   Wt 234 lb (106.1 kg)   SpO2 97%   BMI 36.11 kg/m    Physical Exam Constitutional:      General: She is not in acute distress.    Appearance: She is well-developed. She is not diaphoretic.  HENT:     Right Ear: External ear normal.     Left Ear: External ear normal.     Nose: Nose normal.  Eyes:     Conjunctiva/sclera: Conjunctivae normal.  Cardiovascular:     Rate and Rhythm: Normal rate and regular rhythm.     Heart sounds: No murmur.  Pulmonary:     Effort: Pulmonary effort is normal. No respiratory distress.     Breath sounds: Normal breath sounds. No wheezing.  Musculoskeletal:     Cervical back: Neck supple.  Skin:    General: Skin is warm and dry.     Capillary Refill: Capillary refill takes less than 2 seconds.  Neurological:     Mental Status: She is alert. Mental status is at baseline.  Psychiatric:        Mood and Affect: Mood normal.        Behavior: Behavior normal.           Assessment & Plan:   Problem List  Items Addressed This Visit      Cardiovascular and Mediastinum   Essential hypertension - Primary    Pt with normal BP at home. Continue spironolactone. Labs with normal K.       Relevant Medications   spironolactone (ALDACTONE) 100 MG tablet   Other Relevant Orders   Basic metabolic panel (Completed)     Endocrine   POLYCYSTIC OVARIAN DISEASE   Relevant Medications   spironolactone (ALDACTONE) 100 MG tablet     Musculoskeletal and Integument   Hirsutism    Reviewed may have improved symptoms on higher dose, she would like to try this. Increase dose. Monitor for low BP, though less likely to make a difference.       Relevant Medications   spironolactone (ALDACTONE) 100 MG tablet   Other Relevant Orders   Basic metabolic panel (Completed)     Other   ANEMIA, NORMOCYTIC    Tolerating iron well and has noticed less fatigue. Continue supplement. She will discuss birth control options in more depth with GYN given likely due to heavy  cycles. Repeat labs at next visit.           Return in about 3 months (around 02/03/2020).  Lynnda Child, MD

## 2019-11-03 NOTE — Assessment & Plan Note (Signed)
Reviewed may have improved symptoms on higher dose, she would like to try this. Increase dose. Monitor for low BP, though less likely to make a difference.

## 2019-11-03 NOTE — Assessment & Plan Note (Signed)
Tolerating iron well and has noticed less fatigue. Continue supplement. She will discuss birth control options in more depth with GYN given likely due to heavy cycles. Repeat labs at next visit.

## 2019-11-04 ENCOUNTER — Encounter: Payer: 59 | Admitting: Physical Therapy

## 2019-11-09 ENCOUNTER — Encounter: Payer: 59 | Admitting: Physical Therapy

## 2019-11-11 ENCOUNTER — Encounter: Payer: 59 | Admitting: Physical Therapy

## 2019-11-30 ENCOUNTER — Encounter: Payer: Self-pay | Admitting: Family Medicine

## 2019-11-30 DIAGNOSIS — I10 Essential (primary) hypertension: Secondary | ICD-10-CM

## 2019-12-01 MED ORDER — HYDROCHLOROTHIAZIDE 12.5 MG PO TABS: 12.5000 mg | ORAL_TABLET | Freq: Every day | ORAL | 1 refills | Status: DC

## 2019-12-30 ENCOUNTER — Ambulatory Visit: Payer: Self-pay | Admitting: Physician Assistant

## 2020-01-29 ENCOUNTER — Other Ambulatory Visit: Payer: Self-pay | Admitting: Family Medicine

## 2020-01-29 DIAGNOSIS — I10 Essential (primary) hypertension: Secondary | ICD-10-CM

## 2020-02-08 ENCOUNTER — Ambulatory Visit: Payer: 59 | Admitting: Family Medicine

## 2020-02-23 ENCOUNTER — Other Ambulatory Visit: Payer: Self-pay | Admitting: Family Medicine

## 2020-02-23 DIAGNOSIS — I10 Essential (primary) hypertension: Secondary | ICD-10-CM

## 2020-02-24 NOTE — Telephone Encounter (Signed)
11/30/19 you told pt to come in in a month for BP check and labs.  Last refill: 01/29/20 No future appt.  Please advise.

## 2020-02-25 NOTE — Telephone Encounter (Signed)
Needs appointment and lab check before any further refills

## 2020-10-13 IMAGING — CR DG ABDOMEN 1V
1 series · 2 of 2 positions shown · non-contrast
Comparison: None.

CLINICAL DATA: Back pain

EXAM:
ABDOMEN - 1 VIEW

[Series 1: dg abd 1 view · 0.14mm/px · 2 of 2 slices shown]
[im 1/2]
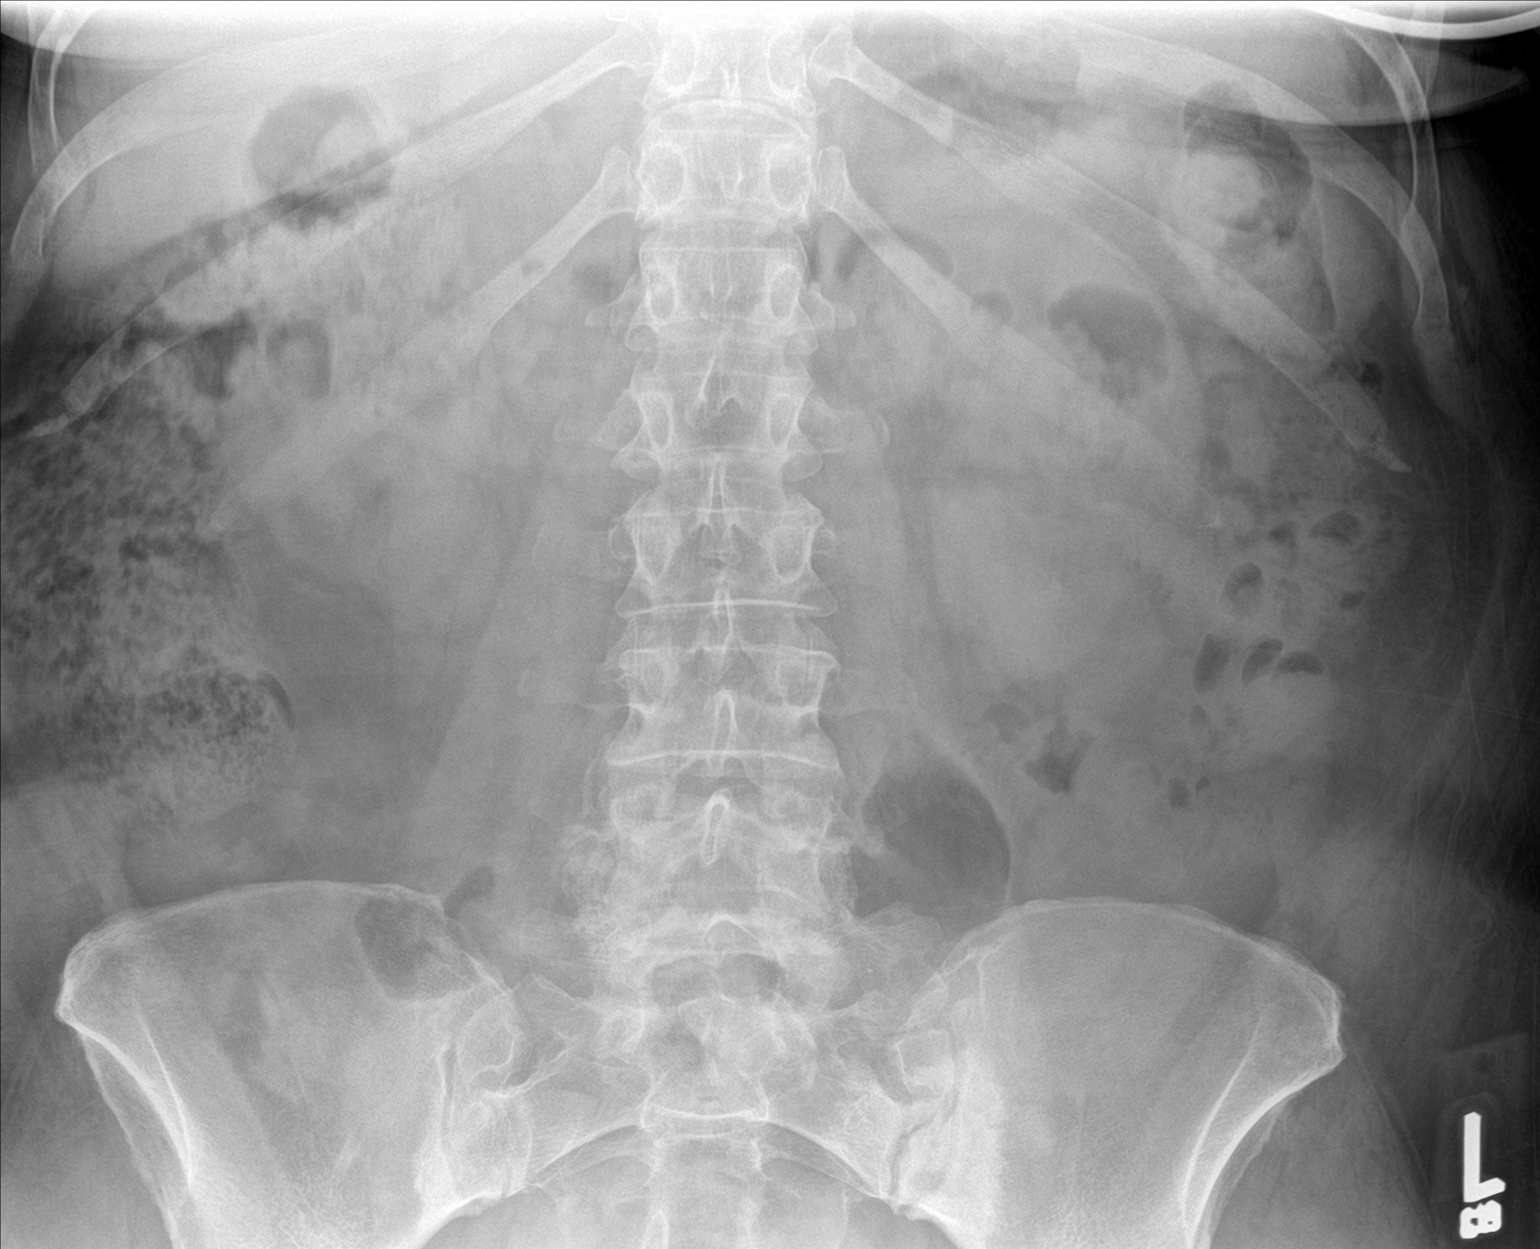
[im 2/2]
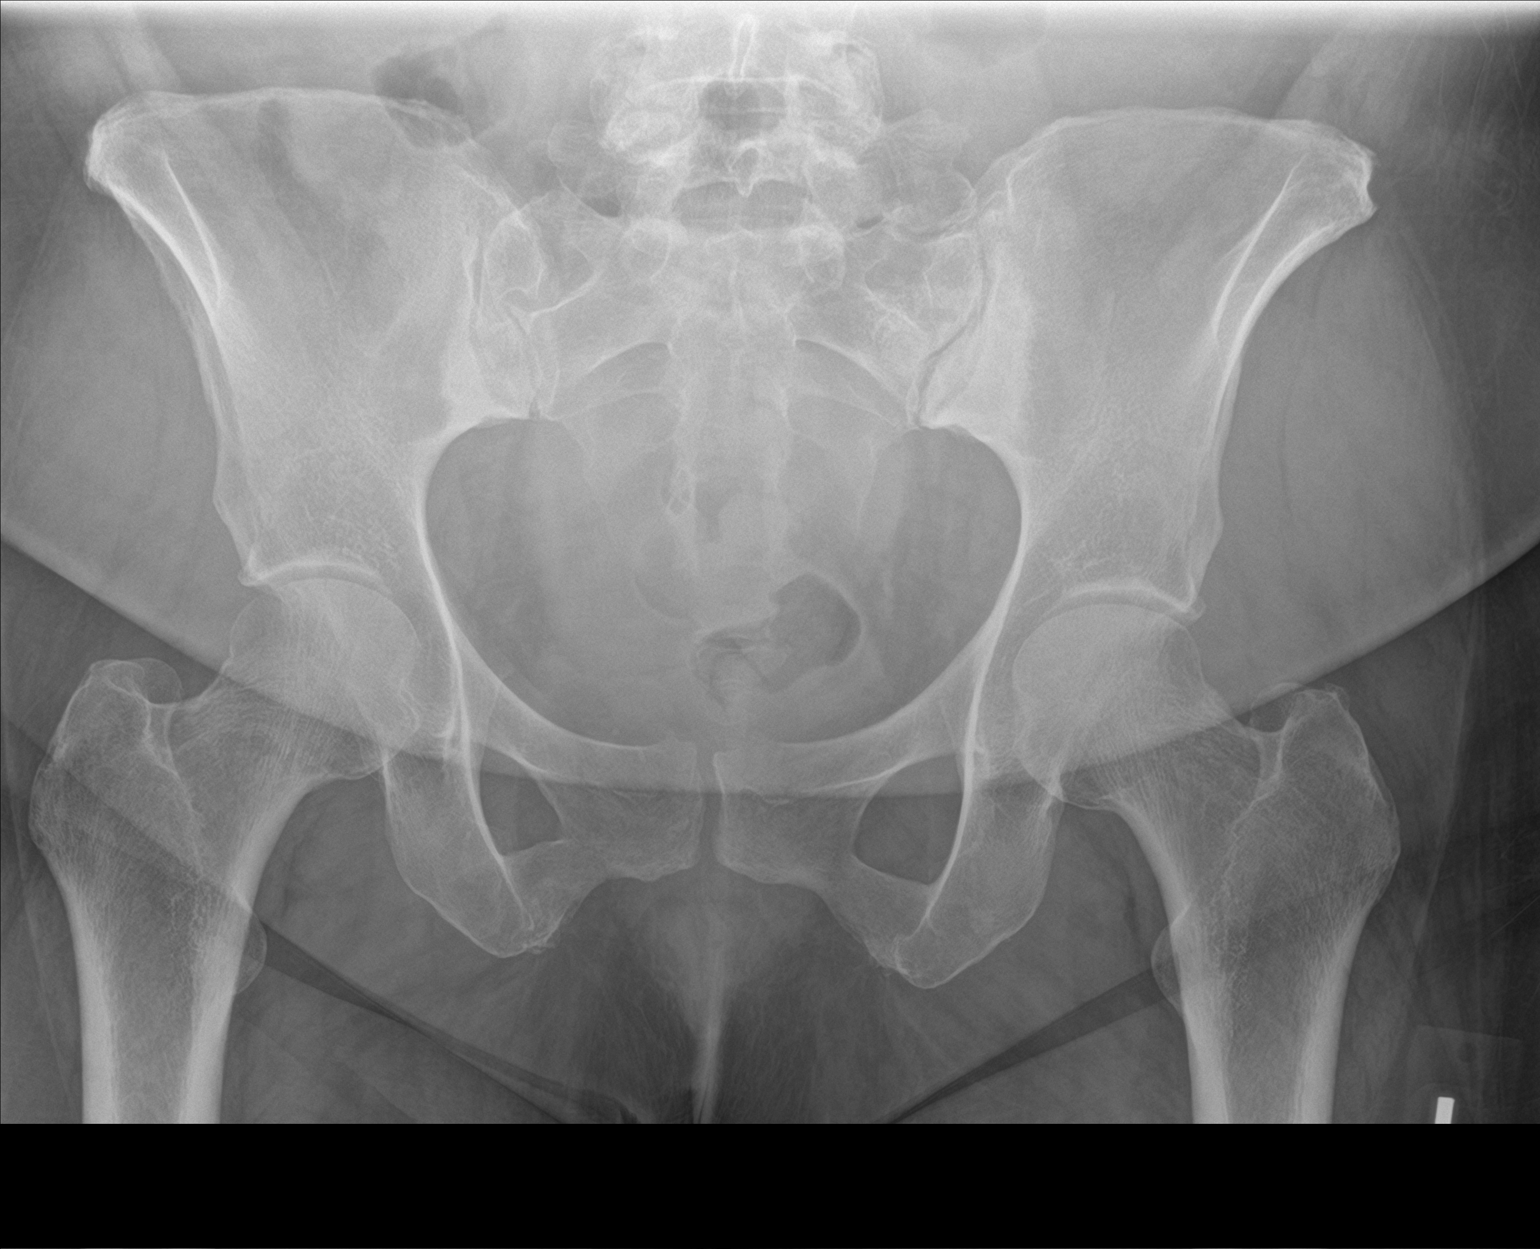

[2 of 2 positions shown; findings below may reference images not displayed]

FINDINGS: The bowel gas pattern is normal. No radio-opaque calculi or other
significant radiographic abnormality are seen.
IMPRESSION: Negative.
